# Patient Record
Sex: Female | Born: 1965 | Race: Black or African American | Hispanic: No | Marital: Married | Smoking: Former smoker
Health system: Southern US, Community
[De-identification: ages and names within clinical notes are randomized; demographics above are authoritative.]

## PROBLEM LIST (undated history)

## (undated) DIAGNOSIS — I10 Essential (primary) hypertension: Secondary | ICD-10-CM

## (undated) DIAGNOSIS — K219 Gastro-esophageal reflux disease without esophagitis: Secondary | ICD-10-CM

## (undated) DIAGNOSIS — F32A Depression, unspecified: Secondary | ICD-10-CM

## (undated) DIAGNOSIS — F329 Major depressive disorder, single episode, unspecified: Secondary | ICD-10-CM

## (undated) DIAGNOSIS — F319 Bipolar disorder, unspecified: Secondary | ICD-10-CM

## (undated) DIAGNOSIS — D649 Anemia, unspecified: Secondary | ICD-10-CM

## (undated) DIAGNOSIS — T7840XA Allergy, unspecified, initial encounter: Secondary | ICD-10-CM

## (undated) HISTORY — DX: Gastro-esophageal reflux disease without esophagitis: K21.9

## (undated) HISTORY — DX: Essential (primary) hypertension: I10

## (undated) HISTORY — DX: Allergy, unspecified, initial encounter: T78.40XA

## (undated) HISTORY — DX: Anemia, unspecified: D64.9

## (undated) HISTORY — DX: Bipolar disorder, unspecified: F31.9

## (undated) HISTORY — DX: Depression, unspecified: F32.A

## (undated) HISTORY — DX: Major depressive disorder, single episode, unspecified: F32.9

---

## 2013-04-22 ENCOUNTER — Ambulatory Visit: Payer: Self-pay

## 2013-05-06 ENCOUNTER — Ambulatory Visit: Payer: Self-pay

## 2013-06-25 LAB — HM PAP SMEAR: HM PAP: NORMAL

## 2013-06-25 LAB — HM MAMMOGRAPHY: HM MAMMO: NORMAL

## 2013-07-07 DIAGNOSIS — Z79899 Other long term (current) drug therapy: Secondary | ICD-10-CM | POA: Diagnosis not present

## 2013-10-05 DIAGNOSIS — L0390 Cellulitis, unspecified: Secondary | ICD-10-CM | POA: Diagnosis not present

## 2013-10-05 DIAGNOSIS — L0291 Cutaneous abscess, unspecified: Secondary | ICD-10-CM | POA: Diagnosis not present

## 2013-11-11 DIAGNOSIS — F200 Paranoid schizophrenia: Secondary | ICD-10-CM | POA: Diagnosis not present

## 2014-01-29 DIAGNOSIS — F4310 Post-traumatic stress disorder, unspecified: Secondary | ICD-10-CM | POA: Diagnosis not present

## 2014-03-09 DIAGNOSIS — F331 Major depressive disorder, recurrent, moderate: Secondary | ICD-10-CM | POA: Diagnosis not present

## 2014-03-16 DIAGNOSIS — F331 Major depressive disorder, recurrent, moderate: Secondary | ICD-10-CM | POA: Diagnosis not present

## 2014-05-05 DIAGNOSIS — F331 Major depressive disorder, recurrent, moderate: Secondary | ICD-10-CM | POA: Diagnosis not present

## 2014-06-09 DIAGNOSIS — I83893 Varicose veins of bilateral lower extremities with other complications: Secondary | ICD-10-CM | POA: Diagnosis not present

## 2014-06-09 DIAGNOSIS — M545 Low back pain: Secondary | ICD-10-CM | POA: Diagnosis not present

## 2014-06-09 DIAGNOSIS — Z0000 Encounter for general adult medical examination without abnormal findings: Secondary | ICD-10-CM | POA: Diagnosis not present

## 2014-06-09 DIAGNOSIS — R400 Somnolence: Secondary | ICD-10-CM | POA: Diagnosis not present

## 2014-06-09 DIAGNOSIS — Z7189 Other specified counseling: Secondary | ICD-10-CM | POA: Diagnosis not present

## 2014-09-16 DIAGNOSIS — H10023 Other mucopurulent conjunctivitis, bilateral: Secondary | ICD-10-CM | POA: Diagnosis not present

## 2014-11-29 DIAGNOSIS — F310 Bipolar disorder, current episode hypomanic: Secondary | ICD-10-CM | POA: Diagnosis not present

## 2015-01-26 DIAGNOSIS — F310 Bipolar disorder, current episode hypomanic: Secondary | ICD-10-CM | POA: Diagnosis not present

## 2015-03-10 ENCOUNTER — Ambulatory Visit (INDEPENDENT_AMBULATORY_CARE_PROVIDER_SITE_OTHER): Payer: Medicare Other | Admitting: Family Medicine

## 2015-03-10 ENCOUNTER — Encounter: Payer: Self-pay | Admitting: Family Medicine

## 2015-03-10 VITALS — BP 138/88 | HR 66 | Temp 98.0°F | Resp 16 | Ht 67.0 in | Wt 223.6 lb

## 2015-03-10 DIAGNOSIS — J40 Bronchitis, not specified as acute or chronic: Secondary | ICD-10-CM | POA: Diagnosis not present

## 2015-03-10 DIAGNOSIS — E669 Obesity, unspecified: Secondary | ICD-10-CM | POA: Diagnosis not present

## 2015-03-10 DIAGNOSIS — J309 Allergic rhinitis, unspecified: Secondary | ICD-10-CM | POA: Diagnosis not present

## 2015-03-10 DIAGNOSIS — F319 Bipolar disorder, unspecified: Secondary | ICD-10-CM | POA: Insufficient documentation

## 2015-03-10 DIAGNOSIS — Z23 Encounter for immunization: Secondary | ICD-10-CM | POA: Diagnosis not present

## 2015-03-10 DIAGNOSIS — F3170 Bipolar disorder, currently in remission, most recent episode unspecified: Secondary | ICD-10-CM

## 2015-03-10 DIAGNOSIS — Z720 Tobacco use: Secondary | ICD-10-CM

## 2015-03-10 DIAGNOSIS — F17200 Nicotine dependence, unspecified, uncomplicated: Secondary | ICD-10-CM | POA: Insufficient documentation

## 2015-03-10 MED ORDER — DOXYCYCLINE HYCLATE 100 MG PO CAPS: 100.0000 mg | ORAL_CAPSULE | Freq: Two times a day (BID) | ORAL | Status: DC

## 2015-03-10 NOTE — Progress Notes (Signed)
Date:  03/10/2015   Name:  Dawn Baldwin   DOB:  1965/11/30   MRN:  [id number]  PCP:  Fidel Levy, MD    Chief Complaint: Cough   History of Present Illness:  This is a 49 y.o. female, new pt to this practice, with 5d hx cough productive brown phlegm, sore throat, rhinorrhea, nasal congestion, mild B otalgia. Sxs not improving. Smokes one cigar a day. Hx bipolar d/o but unsure what med taking (on Risperdal previously). No known infectious exposure except grandchild has ear infection  Review of Systems:  Review of Systems  Constitutional: Negative for fever and chills.  HENT: Negative for trouble swallowing.   Respiratory: Negative for shortness of breath, wheezing and stridor.   Cardiovascular: Negative for chest pain.    Patient Active Problem List   Diagnosis Date Noted  . Bipolar affective disorder 03/10/2015  . Allergic rhinitis 03/10/2015  . Smoker 03/10/2015  . Obesity (BMI 30-39.9) 03/10/2015    Prior to Admission medications   Medication Sig Start Date End Date Taking? Authorizing Provider  doxycycline (VIBRAMYCIN) 100 MG capsule Take 1 capsule (100 mg total) by mouth 2 (two) times daily. 03/10/15   Schuyler Amor, MD    No Known Allergies  No past surgical history on file.  Social History  Substance Use Topics  . Smoking status: Current Every Day Smoker -- 1.00 packs/day    Types: Cigars  . Smokeless tobacco: Not on file  . Alcohol Use: Yes     Comment: 2 cans of beer week    No family history on file.  Medication list has been reviewed and updated.  Physical Examination: BP 138/88 mmHg  Pulse 66  Temp(Src) 98 F (36.7 C) (Oral)  Resp 16  Ht  (1.702 m)  Wt 223 lb 9.6 oz (101.424 kg)  BMI 35.01 kg/m2  SpO2 100%  LMP 02/12/2015  Physical Exam  Constitutional: She appears well-developed and well-nourished.  HENT:  Right Ear: External ear normal.  Left Ear: External ear normal.  Mouth/Throat: No oropharyngeal exudate.  OP mild  erythema B TM clear  Neck: Neck supple. No thyromegaly present.  Pulmonary/Chest: Effort normal and breath sounds normal. She has no wheezes. She has no rales.  Musculoskeletal: She exhibits no edema.  Lymphadenopathy:    She has no cervical adenopathy.  Neurological: She is alert.  Skin: Skin is warm and dry.  Psychiatric: She has a normal mood and affect. Her behavior is normal.    Assessment and Plan:  1. Bronchitis - doxycycline (VIBRAMYCIN) 100 MG capsule; Take 1 capsule (100 mg total) by mouth 2 (two) times daily.  Dispense: 14 capsule; Refill: 0  2. Bipolar affective disorder in remission Release signed to obtain records including meds  3. Allergic rhinitis, unspecified allergic rhinitis type Using OTC nasal spray, unsure of name  4. Smoker Recommended cessation  5. Obesity (BMI 30-39.9) Address next visit  Return in about 4 weeks (around 04/07/2015), or if symptoms worsen or fail to improve.  Dionne Ano. Kingsley Spittle MD [facility]  03/10/2015

## 2015-03-28 ENCOUNTER — Ambulatory Visit: Payer: Self-pay | Admitting: Family Medicine

## 2015-05-13 ENCOUNTER — Encounter: Payer: Self-pay | Admitting: Family Medicine

## 2015-05-13 ENCOUNTER — Ambulatory Visit (INDEPENDENT_AMBULATORY_CARE_PROVIDER_SITE_OTHER): Payer: Medicare Other | Admitting: Family Medicine

## 2015-05-13 VITALS — BP 130/76 | HR 66 | Temp 97.4°F | Resp 26 | Ht 68.0 in | Wt 221.8 lb

## 2015-05-13 DIAGNOSIS — Z8249 Family history of ischemic heart disease and other diseases of the circulatory system: Secondary | ICD-10-CM

## 2015-05-13 DIAGNOSIS — J309 Allergic rhinitis, unspecified: Secondary | ICD-10-CM

## 2015-05-13 DIAGNOSIS — Z720 Tobacco use: Secondary | ICD-10-CM

## 2015-05-13 DIAGNOSIS — F17200 Nicotine dependence, unspecified, uncomplicated: Secondary | ICD-10-CM

## 2015-05-13 DIAGNOSIS — Z0000 Encounter for general adult medical examination without abnormal findings: Secondary | ICD-10-CM | POA: Diagnosis not present

## 2015-05-13 DIAGNOSIS — M545 Low back pain, unspecified: Secondary | ICD-10-CM

## 2015-05-13 DIAGNOSIS — F3170 Bipolar disorder, currently in remission, most recent episode unspecified: Secondary | ICD-10-CM

## 2015-05-13 DIAGNOSIS — E669 Obesity, unspecified: Secondary | ICD-10-CM

## 2015-05-13 NOTE — Assessment & Plan Note (Signed)
Reviewed health risks of obesity. Pt encouraged to continue regular exercise. Referred to DisposableN[employer].[email or web address]  Encouraged 150 minutes exercise per week.

## 2015-05-13 NOTE — Progress Notes (Signed)
Subjective:    Patient ID: Dawn Baldwin, female    DOB: 1965-07-02, 49 y.o.   MRN: [id number]  HPI: Dawn Baldwin is a 49 y.o. female presenting on 05/13/2015 for Annual Exam   HPI  Pt presents for preventative care visit after establishing care for a sick visit in September. Overall doing well.  Pt presents to establish care today. Previous care provider was at [facility].  It has been about 1 year last PCP visit. Records from previous provider will be requested and reviewed. Current medical problems include:  Bipolar disorder: Managed at [facility]. Doing well. Seasonal allergies: Occasional symptoms Low back pain: Chronic. Feels like her back pops when she exercise. 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  Former smoker- Quit 1 month ago!!    Health maintenance:  Last Pap 2015- normal. Abnormal pap in 1993. Cryotherapy done.  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  Patient desires mammogram this year.   Regular exercise- has home gym and [employer]. Walks dogs daily.  LMP: 11/14- her husband had a vasectomy.     Past Medical History  Diagnosis Date  . Allergy     No current outpatient prescriptions on file prior to visit.   No current facility-administered medications on file prior to visit.    Review of Systems  Constitutional: Negative for fever and chills.  HENT: Negative.   Respiratory: Negative for cough, chest tightness and wheezing.   Cardiovascular: Negative for chest pain and leg swelling.  Gastrointestinal: Negative for nausea, vomiting, abdominal pain, diarrhea and constipation.  Endocrine: Negative.  Negative for cold intolerance, heat intolerance, polydipsia, polyphagia and polyuria.  Genitourinary: Negative for dysuria and difficulty urinating.  Musculoskeletal: Positive for back pain.  Neurological: Negative for dizziness, light-headedness and numbness.  Psychiatric/Behavioral: Negative.    Per HPI unless specifically indicated above       Objective:    BP 130/76 mmHg  Pulse 66  Temp(Src) 97.4 F (36.3 C) (Oral)  Resp 26  Ht  (1.727 m)  Wt 221 lb 12.8 oz (100.608 kg)  BMI 33.73 kg/m2  LMP 05/09/2015  Wt Readings from Last 3 Encounters:  05/13/15 221 lb 12.8 oz (100.608 kg)  03/10/15 223 lb 9.6 oz (101.424 kg)    Physical Exam  Constitutional: She is oriented to person, place, and time. She appears well-developed and well-nourished.  HENT:  Head: Normocephalic and atraumatic.  Neck: Neck supple.  Cardiovascular: Normal rate, regular rhythm and normal heart sounds.  Exam reveals no gallop and no friction rub.   No murmur heard. Pulmonary/Chest: Effort normal and breath sounds normal. She has no wheezes. She exhibits no tenderness.  Abdominal: Soft. Normal appearance and bowel sounds are normal. She exhibits no distension and no mass. There is no tenderness. There is no rebound and no guarding.  Musculoskeletal: Normal range of motion. She exhibits no edema.       Lumbar back: She exhibits tenderness (L umbar spine). She exhibits normal range of motion, no edema, no deformity and no spasm.  Lymphadenopathy:    She has no cervical adenopathy.  Neurological: She is alert and oriented to person, place, and time.  Skin: Skin is warm and dry.   Results for orders placed or performed in visit on 05/13/15  HM MAMMOGRAPHY  Result Value Ref Range   HM Mammogram normal   HM PAP SMEAR  Result Value Ref Range   HM Pap smear normal       Assessment &  Plan:   Problem List Items Addressed This Visit      Respiratory   Allergic rhinitis    Continue flonase OTC.       Relevant Orders   Comprehensive metabolic panel     Other   Bipolar affective disorder (HCC)    Managed at [facility].       Smoker    Quit smoking 1 mos ago. Encouraged to continue positive changes. Referred to [place] [employer]uitline for further resources.       Obesity (BMI 30-39.9)    Reviewed health risks of obesity. Pt encouraged to continue regular  exercise. Referred to DisposableN[employer].[email or web address]  Encouraged 150 minutes exercise per week.         Relevant Orders   Lipid panel    Other Visit Diagnoses    Preventative health care    -  Primary    Reviewed health maintenance needs. Discussed mammogram guidelines. Pt would like to screen for breast cancer this year.     Family history of heart disease        Relevant Orders    Lipid panel    Left-sided low back pain without sciatica           No orders of the defined types were placed in this encounter.      Follow up plan: Return in about 1 year (around 05/12/2016).

## 2015-05-13 NOTE — Assessment & Plan Note (Signed)
Quit smoking 1 mos ago. Encouraged to continue positive changes. Referred to [place] Quitline for further resources.

## 2015-05-13 NOTE — Patient Instructions (Signed)

## 2015-05-13 NOTE — Assessment & Plan Note (Signed)
Continue flonase OTC.

## 2015-05-13 NOTE — Assessment & Plan Note (Signed)
Managed at [facility].  

## 2015-05-17 DIAGNOSIS — F310 Bipolar disorder, current episode hypomanic: Secondary | ICD-10-CM | POA: Diagnosis not present

## 2015-05-23 DIAGNOSIS — J309 Allergic rhinitis, unspecified: Secondary | ICD-10-CM | POA: Diagnosis not present

## 2015-05-23 DIAGNOSIS — Z8249 Family history of ischemic heart disease and other diseases of the circulatory system: Secondary | ICD-10-CM | POA: Diagnosis not present

## 2015-05-23 DIAGNOSIS — E669 Obesity, unspecified: Secondary | ICD-10-CM | POA: Diagnosis not present

## 2015-05-24 LAB — LIPID PANEL
CHOL/HDL RATIO: 2.9 ratio (ref 0.0–4.4)
Cholesterol, Total: 186 mg/dL (ref 100–199)
HDL: 65 mg/dL (ref >39)
LDL Calculated: 93 mg/dL (ref 0–99)
Triglycerides: 141 mg/dL (ref 0–149)
VLDL Cholesterol Cal: 28 mg/dL (ref 5–40)

## 2015-05-24 LAB — COMPREHENSIVE METABOLIC PANEL
ALBUMIN: 4.5 g/dL (ref 3.5–5.5)
ALT: 18 IU/L (ref 0–32)
AST: 17 IU/L (ref 0–40)
Albumin/Globulin Ratio: 1.9 (ref 1.1–2.5)
Alkaline Phosphatase: 56 IU/L (ref 39–117)
BUN / CREAT RATIO: 15 (ref 9–23)
BUN: 13 mg/dL (ref 6–24)
Bilirubin Total: 0.3 mg/dL (ref 0.0–1.2)
CALCIUM: 9.8 mg/dL (ref 8.7–10.2)
CHLORIDE: 101 mmol/L (ref 97–106)
CO2: 25 mmol/L (ref 18–29)
CREATININE: 0.89 mg/dL (ref 0.57–1.00)
GFR calc non Af Amer: 77 mL/min/{1.73_m2} (ref >59)
GFR, EST AFRICAN AMERICAN: 89 mL/min/{1.73_m2} (ref >59)
GLUCOSE: 88 mg/dL (ref 65–99)
Globulin, Total: 2.4 g/dL (ref 1.5–4.5)
Potassium: 4.2 mmol/L (ref 3.5–5.2)
Sodium: 140 mmol/L (ref 136–144)
TOTAL PROTEIN: 6.9 g/dL (ref 6.0–8.5)

## 2015-06-02 ENCOUNTER — Encounter: Payer: Self-pay | Admitting: Family Medicine

## 2015-06-02 ENCOUNTER — Emergency Department: Payer: Medicare Other

## 2015-06-02 ENCOUNTER — Ambulatory Visit (INDEPENDENT_AMBULATORY_CARE_PROVIDER_SITE_OTHER): Payer: Medicare Other | Admitting: Family Medicine

## 2015-06-02 ENCOUNTER — Emergency Department
Admission: EM | Admit: 2015-06-02 | Discharge: 2015-06-02 | Disposition: A | Discharge status: Home / Self Care | Payer: Medicare Other | Attending: Emergency Medicine | Admitting: Emergency Medicine

## 2015-06-02 ENCOUNTER — Encounter: Payer: Self-pay | Admitting: Emergency Medicine

## 2015-06-02 VITALS — BP 160/100 | HR 85 | Temp 97.7°F | Resp 14

## 2015-06-02 DIAGNOSIS — R519 Headache, unspecified: Secondary | ICD-10-CM

## 2015-06-02 DIAGNOSIS — H538 Other visual disturbances: Secondary | ICD-10-CM | POA: Diagnosis not present

## 2015-06-02 DIAGNOSIS — H53143 Visual discomfort, bilateral: Secondary | ICD-10-CM | POA: Diagnosis not present

## 2015-06-02 DIAGNOSIS — Z87891 Personal history of nicotine dependence: Secondary | ICD-10-CM | POA: Insufficient documentation

## 2015-06-02 DIAGNOSIS — F40298 Other specified phobia: Secondary | ICD-10-CM | POA: Insufficient documentation

## 2015-06-02 DIAGNOSIS — Z3202 Encounter for pregnancy test, result negative: Secondary | ICD-10-CM | POA: Diagnosis not present

## 2015-06-02 DIAGNOSIS — IMO0001 Reserved for inherently not codable concepts without codable children: Secondary | ICD-10-CM

## 2015-06-02 DIAGNOSIS — R51 Headache: Secondary | ICD-10-CM | POA: Diagnosis not present

## 2015-06-02 DIAGNOSIS — R112 Nausea with vomiting, unspecified: Secondary | ICD-10-CM

## 2015-06-02 DIAGNOSIS — R030 Elevated blood-pressure reading, without diagnosis of hypertension: Secondary | ICD-10-CM

## 2015-06-02 DIAGNOSIS — G4485 Primary stabbing headache: Secondary | ICD-10-CM | POA: Diagnosis not present

## 2015-06-02 LAB — CBC
HCT: 40.9 % (ref 35.0–47.0)
Hemoglobin: 13.5 g/dL (ref 12.0–16.0)
MCH: 31.7 pg (ref 26.0–34.0)
MCHC: 32.9 g/dL (ref 32.0–36.0)
MCV: 96.2 fL (ref 80.0–100.0)
PLATELETS: 285 10*3/uL (ref 150–440)
RBC: 4.26 MIL/uL (ref 3.80–5.20)
RDW: 12.2 % (ref 11.5–14.5)
WBC: 5.2 10*3/uL (ref 3.6–11.0)

## 2015-06-02 LAB — DIFFERENTIAL
Basophils Absolute: 0 10*3/uL (ref 0–0.1)
Basophils Relative: 1 %
EOS ABS: 0.1 10*3/uL (ref 0–0.7)
EOS PCT: 2 %
LYMPHS ABS: 1.5 10*3/uL (ref 1.0–3.6)
Lymphocytes Relative: 29 %
Monocytes Absolute: 0.3 10*3/uL (ref 0.2–0.9)
Monocytes Relative: 7 %
NEUTROS PCT: 61 %
Neutro Abs: 3.2 10*3/uL (ref 1.4–6.5)

## 2015-06-02 LAB — COMPREHENSIVE METABOLIC PANEL
ALBUMIN: 4.8 g/dL (ref 3.5–5.0)
ALT: 23 U/L (ref 14–54)
ANION GAP: 7 (ref 5–15)
AST: 20 U/L (ref 15–41)
Alkaline Phosphatase: 49 U/L (ref 38–126)
BILIRUBIN TOTAL: 0.6 mg/dL (ref 0.3–1.2)
BUN: 13 mg/dL (ref 6–20)
CO2: 28 mmol/L (ref 22–32)
Calcium: 9.6 mg/dL (ref 8.9–10.3)
Chloride: 106 mmol/L (ref 101–111)
Creatinine, Ser: 0.79 mg/dL (ref 0.44–1.00)
GFR calc Af Amer: >60 mL/min (ref >60)
GFR calc non Af Amer: >60 mL/min (ref >60)
GLUCOSE: 88 mg/dL (ref 65–99)
POTASSIUM: 3.9 mmol/L (ref 3.5–5.1)
SODIUM: 141 mmol/L (ref 135–145)
TOTAL PROTEIN: 7.9 g/dL (ref 6.5–8.1)

## 2015-06-02 LAB — PROTIME-INR
INR: 0.93
PROTHROMBIN TIME: 12.7 s (ref 11.4–15.0)

## 2015-06-02 LAB — HCG, QUANTITATIVE, PREGNANCY: hCG, Beta Chain, Quant, S: 3 m[IU]/mL (ref <5)

## 2015-06-02 LAB — GLUCOSE, CAPILLARY: Glucose-Capillary: 73 mg/dL (ref 65–99)

## 2015-06-02 LAB — APTT: aPTT: 34 seconds (ref 24–36)

## 2015-06-02 MED ORDER — LIDOCAINE HCL (PF) 1 % IJ SOLN: 5.0000 mL | Freq: Once | INTRAMUSCULAR | Status: DC

## 2015-06-02 MED ORDER — LIDOCAINE HCL (PF) 1 % IJ SOLN
30.0000 mL | Freq: Once | INTRAMUSCULAR | Status: DC
  Filled 2015-06-02: qty 30

## 2015-06-02 MED ORDER — HYDROMORPHONE HCL 1 MG/ML IJ SOLN
1.0000 mg | Freq: Once | INTRAMUSCULAR | Status: AC
  Administered 2015-06-02: 1 mg via INTRAVENOUS
  Filled 2015-06-02: qty 1

## 2015-06-02 MED ORDER — HYDROMORPHONE HCL 1 MG/ML IJ SOLN
INTRAMUSCULAR | Status: AC
  Administered 2015-06-02: 0.5 mg via INTRAVENOUS
  Filled 2015-06-02: qty 1

## 2015-06-02 MED ORDER — PROCHLORPERAZINE EDISYLATE 5 MG/ML IJ SOLN
10.0000 mg | Freq: Four times a day (QID) | INTRAMUSCULAR | Status: DC | PRN
  Administered 2015-06-02: 10 mg via INTRAVENOUS
  Filled 2015-06-02: qty 2

## 2015-06-02 MED ORDER — SODIUM CHLORIDE 0.9 % IV BOLUS (SEPSIS)
1000.0000 mL | Freq: Once | INTRAVENOUS | Status: AC
  Administered 2015-06-02: 1000 mL via INTRAVENOUS

## 2015-06-02 MED ORDER — ONDANSETRON HCL 4 MG/2ML IJ SOLN
4.0000 mg | Freq: Once | INTRAMUSCULAR | Status: AC
  Administered 2015-06-02: 4 mg via INTRAVENOUS

## 2015-06-02 MED ORDER — HYDROMORPHONE HCL 1 MG/ML IJ SOLN
0.5000 mg | Freq: Once | INTRAMUSCULAR | Status: AC
  Administered 2015-06-02: 0.5 mg via INTRAVENOUS

## 2015-06-02 MED ORDER — LIDOCAINE HCL (PF) 1 % IJ SOLN
INTRAMUSCULAR | Status: AC
  Filled 2015-06-02: qty 5

## 2015-06-02 MED ORDER — ONDANSETRON HCL 4 MG/2ML IJ SOLN
INTRAMUSCULAR | Status: AC
  Administered 2015-06-02: 4 mg via INTRAVENOUS
  Filled 2015-06-02: qty 2

## 2015-06-02 NOTE — ED Notes (Signed)
Patient transported to CT 

## 2015-06-02 NOTE — ED Notes (Signed)
C/o worst HA ever starting at 4 am. Right frontal HA. Blurry vision bilateral. No speech abnormalities, no facial droop, no motor deficits. Has nausea.

## 2015-06-02 NOTE — Progress Notes (Signed)
Subjective:    Patient ID: Dawn Baldwin, female    DOB: December 25, 1965, 48 y.o.   MRN: [id number]  HPI: Dawn Baldwin is a 49 y.o. female presenting on 06/02/2015 for Headache   HPI  Pt presents for HA. Woke her up from sleep this morning with the worst HA of her life. Sensitive to light and sound. BP is very elevated. Vomiting x 3. Pt reports she broke out in a cold sweat at work.  Pain is behind her eyes and high up in her head. Pulsing pain. She took tylenol at home for HA no relief.    Past Medical History  Diagnosis Date  . Allergy     No current outpatient prescriptions on file prior to visit.   No current facility-administered medications on file prior to visit.    Review of Systems  Constitutional: Positive for diaphoresis. Negative for fever and chills.  Eyes: Positive for photophobia and visual disturbance.  Respiratory: Negative for chest tightness, shortness of breath and wheezing.   Cardiovascular: Negative for chest pain, palpitations and leg swelling.  Gastrointestinal: Positive for vomiting.  Neurological: Positive for headaches.   Per HPI unless specifically indicated above     Objective:    BP 160/100 mmHg  Pulse 85  Temp(Src) 97.7 F (36.5 C) (Oral)  Resp 14  LMP 05/09/2015  Wt Readings from Last 3 Encounters:  05/13/15 221 lb 12.8 oz (100.608 kg)  03/10/15 223 lb 9.6 oz (101.424 kg)    Physical Exam  Constitutional: She is oriented to person, place, and time. She appears well-developed and well-nourished.  HENT:  Head: Normocephalic and atraumatic.  Eyes: Conjunctivae and EOM are normal. Pupils are equal, round, and reactive to light. Lids are everted and swept, no foreign bodies found.  Neck: Normal range of motion. Neck supple. No Brudzinski's sign and no Kernig's sign noted.  Cardiovascular: Normal rate and regular rhythm.  Exam reveals no gallop and no friction rub.   No murmur heard. Pulmonary/Chest: Effort normal. No respiratory distress.  She has no rales.  Abdominal: Soft. Normal appearance and bowel sounds are normal. There is no tenderness.  Neurological: She is alert and oriented to person, place, and time. She has normal strength and normal reflexes. No cranial nerve deficit or sensory deficit. She displays a negative Romberg sign. Coordination and gait normal.   Results for orders placed or performed in visit on 05/13/15  HM MAMMOGRAPHY  Result Value Ref Range   HM Mammogram normal   HM PAP SMEAR  Result Value Ref Range   HM Pap smear normal   Lipid panel  Result Value Ref Range   Cholesterol, Total 186 100 - 199 mg/dL   Triglycerides 130 0 - 149 mg/dL   HDL 65 >86 mg/dL   VLDL Cholesterol Cal 28 5 - 40 mg/dL   LDL Calculated 93 0 - 99 mg/dL   Chol/HDL Ratio 2.9 0.0 - 4.4 ratio units  Comprehensive metabolic panel  Result Value Ref Range   Glucose 88 65 - 99 mg/dL   BUN 13 6 - 24 mg/dL   Creatinine, Ser 5.78 0.57 - 1.00 mg/dL   GFR calc non Af Amer 77 >59 mL/min/1.73   GFR calc Af Amer 89 >59 mL/min/1.73   BUN/Creatinine Ratio 15 9 - 23   Sodium 140 136 - 144 mmol/L   Potassium 4.2 3.5 - 5.2 mmol/L   Chloride 101 97 - 106 mmol/L   CO2 25 18 - 29 mmol/L   Calcium  9.8 8.7 - 10.2 mg/dL   Total Protein 6.9 6.0 - 8.5 g/dL   Albumin 4.5 3.5 - 5.5 g/dL   Globulin, Total 2.4 1.5 - 4.5 g/dL   Albumin/Globulin Ratio 1.9 1.1 - 2.5   Bilirubin Total 0.3 0.0 - 1.2 mg/dL   Alkaline Phosphatase 56 39 - 117 IU/L   AST 17 0 - 40 IU/L   ALT 18 0 - 32 IU/L      Assessment & Plan:   Problem List Items Addressed This Visit    None    Visit Diagnoses    Elevated BP    -  Primary    BP elevated. Unclear if due pain to BP elevation is causing HA.  Sent to ER for evaluation.  Report called to ER charge RN. EMS took patient.  BP lying down 160/102    Primary stabbing headache        ER eval because HA woke her from sleep, elevated BP,  N/V, diaphoresis. Neurologically intact. Elevated BP. Sent via EMS. Report called to  charge RN.         No orders of the defined types were placed in this encounter.      Follow up plan: No Follow-up on file.

## 2015-06-02 NOTE — ED Provider Notes (Signed)
[facility] Emergency Department Provider Note  ____________________________________________  Time seen: Approximately 9:50 AM  I have reviewed the triage vital signs and the nursing notes.   HISTORY  Chief Complaint Headache    HPI Dawn Baldwin is a 49 y.o. female with a history of headaches presenting with severe headache. Patient reports that she woke up this morning with a severe "throbbing" headache in the right frontal region of her head and behind her eye. It was associated with nausea and vomiting, photophobia and phonophobia. She denies any neck pain or stiff neck, fever, chills, numbness tingling or weakness, or difficulty walking. No known trauma.   Past Medical History  Diagnosis Date  . Allergy     Patient Active Problem List   Diagnosis Date Noted  . Bipolar affective disorder (HCC) 03/10/2015  . Allergic rhinitis 03/10/2015  . Smoker 03/10/2015  . Obesity (BMI 30-39.9) 03/10/2015   Family history: Negative for cerebral aneurysm  Social history: Quit smoking 2 months ago. Denies cocaine. History reviewed. No pertinent past surgical history.  No current outpatient prescriptions on file.  Allergies Review of patient's allergies indicates no known allergies.  Family History  Problem Relation Age of Onset  . Hypertension Mother     Social History Social History  Substance Use Topics  . Smoking status: Former Smoker -- 1.00 packs/day    Types: Cigars    Quit date: 03/26/2015  . Smokeless tobacco: None  . Alcohol Use: Yes     Comment: 2 cans of beer week    Review of Systems Constitutional: No fever/chills. No lightheadedness or syncope. Eyes: No visual changes. Positive bilateral blurred vision. Positive photophobia. ENT: No sore throat. Cardiovascular: Denies chest pain, palpitations. Respiratory: Denies shortness of breath.  No cough. Gastrointestinal: No abdominal pain.  Positive nausea, positive vomiting.  No  diarrhea.  No constipation. Genitourinary: Negative for dysuria. Musculoskeletal: Negative for back pain. Skin: Negative for rash. Neurological: Positive for severe frontal headache. Negative for numbness tingling or weakness.  10-point ROS otherwise negative.  ____________________________________________   PHYSICAL EXAM:  VITAL SIGNS: ED Triage Vitals  Enc Vitals Group     BP 06/02/15 0940 195/104 mmHg     Pulse Rate 06/02/15 0940 64     Resp 06/02/15 0940 16     Temp 06/02/15 0940 97.7 F (36.5 C)     Temp Source 06/02/15 0940 Oral     SpO2 06/02/15 0940 100 %     Weight 06/02/15 0940 216 lb 14.9 oz (98.4 kg)     Height 06/02/15 0940 5\' 7"  (1.702 m)     Head Cir --      Peak Flow --      Pain Score --      Pain Loc --      Pain Edu? --      Excl. in GC? --     Constitutional: Alert and oriented. Uncomfortable but nontoxic.Marland Kitchen. Answer question appropriately. Eyes: Conjunctivae are normal.  EOMI. no scleral icterus. PERRLA. Head: Atraumatic. No raccoon eyes or Battle sign. Nose: No congestion/rhinnorhea. Mouth/Throat: Mucous membranes are moist.  Neck: No stridor.  Supple.  Trachea is midline. No meningismus. Cardiovascular: Normal rate, regular rhythm. No murmurs, rubs or gallops.  Respiratory: Normal respiratory effort.  No retractions. Lungs CTAB.  No wheezes, rales or ronchi. Gastrointestinal: Soft and nontender. No distention. No peritoneal signs. Musculoskeletal: No LE edema.  Neurologic:  Patient is alert and oriented 3. Face and smile are symmetric. EOMI and PERRLA.  Good strength in all her extremities. Skin:  Skin is warm, dry and intact. No rash noted. Psychiatric: Mood and affect are normal. Speech and behavior are normal.  Normal judgement.  ____________________________________________   LABS (all labs ordered are listed, but only abnormal results are displayed)  Labs Reviewed  PROTIME-INR  APTT  CBC  DIFFERENTIAL  COMPREHENSIVE METABOLIC PANEL   HCG, QUANTITATIVE, PREGNANCY  GLUCOSE, CAPILLARY  URINALYSIS COMPLETEWITH MICROSCOPIC ([facility] ONLY)  CBG MONITORING, ED   ____________________________________________  EKG  Not indicated ____________________________________________  RADIOLOGY  Ct Head Wo Contrast  06/02/2015  CLINICAL DATA:  Code stroke. Worse headache of life. High blood pressure and vomiting. Pain behind eyes. Pulsating pain. EXAM: CT HEAD WITHOUT CONTRAST TECHNIQUE: Contiguous axial images were obtained from the base of the skull through the vertex without intravenous contrast. COMPARISON:  None. FINDINGS: No acute intracranial hemorrhage. No focal mass lesion. No CT evidence of acute infarction. No midline shift or mass effect. No hydrocephalus. Basilar cisterns are patent. Paranasal sinuses and  mastoid air cells are clear. IMPRESSION: 1. CT evidence of cortical infarction. 2. No intracranial hemorrhage. 3. Normal head CT. Findings conveyed toANNE-CAROLINE Roshon Duell on 06/02/2015  at10:10. Electronically Signed   By: Genevive Bi M.D.   On: 06/02/2015 10:12    ____________________________________________   PROCEDURES  Procedure(s) performed: None  Critical Care performed: No ____________________________________________   INITIAL IMPRESSION / ASSESSMENT AND PLAN / ED COURSE  Pertinent labs & imaging results that were available during my care of the patient were reviewed by me and considered in my medical decision making (see chart for details).  49 y.o. female with a history of mild headaches presenting with an atypically severe headache associated with nausea vomiting, blurred vision, photophobia and phonophobia. It is possible that the patient is having an atypical migraine however I'm concerned about subarachnoid hemorrhage.  She is hypertensive today and I will treat her pain and recheck her blood pressure after we have the CT scan results.  ----------------------------------------- 11:19 AM on  06/02/2015 -----------------------------------------  The patient continues to have a headache although it is slightly improved. She states that her nausea has completely resolved at this time. Her CT scan is negative and her labs are reassuring. I have talked to the patient about lumbar puncture, explaining the procedure as well as the risks and the benefits of doing that test. I have told her that given the symptoms that she has, I am still concerned about subarachnoid hemorrhage. At this time, the patient would like to defer testing for more symptomatic treatment.  ----------------------------------------- 1:52 PM on 06/02/2015 -----------------------------------------  At this time, the patient is feeling much better and wishes discharge. I have had a long conversation with her about performing an LP in order to rule out subarachnoid hemorrhage, but she is refusing and prefers discharge at this time. I have had a long discussion with the patient and her husband about return precautions and follow-up instructions. She understands that if she leaves the hospital and has a subarachnoid hemorrhage that she may have permanent neurologic damage or even death.   ____________________________________________  FINAL CLINICAL IMPRESSION(S) / ED DIAGNOSES  Final diagnoses:  Acute nonintractable headache, unspecified headache type  Non-intractable vomiting with nausea, vomiting of unspecified type  Photophobia of both eyes  Phonophobia      NEW MEDICATIONS STARTED DURING THIS VISIT:  New Prescriptions   No medications on file     Rockne Menghini, MD 06/02/15 1353

## 2015-06-02 NOTE — Discharge Instructions (Signed)
Please drink plenty of fluids stay well-hydrated and get plenty of rest. You may take Tylenol for headache. Today, you are having a severe headache and had a CT scan that was reassuring. As we discussed, and negative CT scan does not mean that we can say for sure that you did not have bleeding in your brain. By leaving today without a lumbar puncture, we are not 100% sure that you do not have bleeding in her brain. If your headache worsens, if you develop visual changes, mental status changes, numbness tingling or weakness, vomiting, or fever, return to the emergency department immediately.

## 2015-06-03 ENCOUNTER — Encounter: Payer: Self-pay | Admitting: Family Medicine

## 2015-06-03 ENCOUNTER — Ambulatory Visit (INDEPENDENT_AMBULATORY_CARE_PROVIDER_SITE_OTHER): Payer: Medicare Other | Admitting: Family Medicine

## 2015-06-03 VITALS — BP 160/90 | HR 66 | Temp 98.4°F | Resp 16 | Ht 68.0 in | Wt 219.0 lb

## 2015-06-03 DIAGNOSIS — I10 Essential (primary) hypertension: Secondary | ICD-10-CM

## 2015-06-03 DIAGNOSIS — G4485 Primary stabbing headache: Secondary | ICD-10-CM | POA: Diagnosis not present

## 2015-06-03 MED ORDER — AMLODIPINE BESYLATE 5 MG PO TABS: 5.0000 mg | ORAL_TABLET | Freq: Every day | ORAL | Status: DC

## 2015-06-03 MED ORDER — BLOOD PRESSURE CUFF MISC: 1.0000 | Freq: Every day | Status: AC

## 2015-06-03 NOTE — Progress Notes (Signed)
Subjective:    Patient ID: Dawn Baldwin, female    DOB: 10/06/1965, 49 y.o.   MRN: [id number]  HPI: Dawn Baldwin is a 49 y.o. female presenting on 06/03/2015 for Hypertension   HPI  Pt presents for ER follow-up. Transfer[facility] to [facility] by EMS for HA and elevated BP yesterday.  CT negative. HA is gone but BP is still high. No visual changes. Pt has been borderline hypertensive in the past. Denies CP, sweating, visual changes, SOB.  Past Medical History  Diagnosis Date  . Allergy     No current outpatient prescriptions on file prior to visit.   No current facility-administered medications on file prior to visit.    Review of Systems  Constitutional: Negative for fever and chills.  HENT: Negative.   Respiratory: Negative for cough, chest tightness and wheezing.   Cardiovascular: Negative for chest pain and leg swelling.  Gastrointestinal: Negative for nausea, vomiting, abdominal pain, diarrhea and constipation.  Endocrine: Negative.  Negative for cold intolerance, heat intolerance, polydipsia, polyphagia and polyuria.  Genitourinary: Negative for dysuria and difficulty urinating.  Musculoskeletal: Negative.   Neurological: Negative for dizziness, light-headedness and numbness.  Psychiatric/Behavioral: Negative.    Per HPI unless specifically indicated above     Objective:    BP 160/90 mmHg  Pulse 66  Temp(Src) 98.4 F (36.9 C) (Oral)  Resp 16  Ht 5\' 8"  (1.727 m)  Wt 219 lb (99.338 kg)  BMI 33.31 kg/m2  LMP 05/09/2015  Wt Readings from Last 3 Encounters:  06/03/15 219 lb (99.338 kg)  06/02/15 216 lb 14.9 oz (98.4 kg)  05/13/15 221 lb 12.8 oz (100.608 kg)    BP Readings from Last 3 Encounters:  06/03/15 160/90  06/02/15 156/84  06/02/15 160/100    Physical Exam  Constitutional: She is oriented to person, place, and time. She appears well-developed and well-nourished.  HENT:  Head: Normocephalic and atraumatic.  Neck: Neck supple.  Cardiovascular: Normal rate,  regular rhythm and normal heart sounds.  Exam reveals no gallop and no friction rub.   No murmur heard. Pulmonary/Chest: Effort normal and breath sounds normal. She has no wheezes. She exhibits no tenderness.  Abdominal: Soft. Normal appearance and bowel sounds are normal. She exhibits no distension and no mass. There is no tenderness. There is no rebound and no guarding.  Musculoskeletal: Normal range of motion. She exhibits no edema or tenderness.  Lymphadenopathy:    She has no cervical adenopathy.  Neurological: She is alert and oriented to person, place, and time.  Skin: Skin is warm and dry.   Results for orders placed or performed during the hospital encounter of 06/02/15  Protime-INR  Result Value Ref Range   Prothrombin Time 12.7 11.4 - 15.0 seconds   INR 0.93   APTT  Result Value Ref Range   aPTT 34 24 - 36 seconds  CBC  Result Value Ref Range   WBC 5.2 3.6 - 11.0 K/uL   RBC 4.26 3.80 - 5.20 MIL/uL   Hemoglobin 13.5 12.0 - 16.0 g/dL   HCT 96.740.9 89.335.0 - 81.047.0 %   MCV 96.2 80.0 - 100.0 fL   MCH 31.7 26.0 - 34.0 pg   MCHC 32.9 32.0 - 36.0 g/dL   RDW 17.512.2 10.211.5 - 58.514.5 %   Platelets 285 150 - 440 K/uL  Differential  Result Value Ref Range   Neutrophils Relative % 61 %   Neutro Abs 3.2 1.4 - 6.5 K/uL   Lymphocytes Relative 29 %  Lymphs Abs 1.5 1.0 - 3.6 K/uL   Monocytes Relative 7 %   Monocytes Absolute 0.3 0.2 - 0.9 K/uL   Eosinophils Relative 2 %   Eosinophils Absolute 0.1 0 - 0.7 K/uL   Basophils Relative 1 %   Basophils Absolute 0.0 0 - 0.1 K/uL  Comprehensive metabolic panel  Result Value Ref Range   Sodium 141 135 - 145 mmol/L   Potassium 3.9 3.5 - 5.1 mmol/L   Chloride 106 101 - 111 mmol/L   CO2 28 22 - 32 mmol/L   Glucose, Bld 88 65 - 99 mg/dL   BUN 13 6 - 20 mg/dL   Creatinine, Ser 1.61 0.44 - 1.00 mg/dL   Calcium 9.6 8.9 - 09.6 mg/dL   Total Protein 7.9 6.5 - 8.1 g/dL   Albumin 4.8 3.5 - 5.0 g/dL   AST 20 15 - 41 U/L   ALT 23 14 - 54 U/L   Alkaline  Phosphatase 49 38 - 126 U/L   Total Bilirubin 0.6 0.3 - 1.2 mg/dL   GFR calc non Af Amer >60 >60 mL/min   GFR calc Af Amer >60 >60 mL/min   Anion gap 7 5 - 15  hCG, quantitative, pregnancy  Result Value Ref Range   hCG, Beta Chain, Quant, S 3 <5 mIU/mL  Glucose, capillary  Result Value Ref Range   Glucose-Capillary 73 65 - 99 mg/dL      Assessment & Plan:   Problem List Items Addressed This Visit      Cardiovascular and Mediastinum   Hypertension - Primary    Start CCB today given AA descent. Pt cautioned to recheck BP daily at home.  Call if BP > 170/90 consistently. Go to ER if >200/100 with symptoms.   DASH diet reviewed. CMP normal.  Consider diurectic with refractory hypertension.  RTC 2 weeks.      Relevant Medications   amLODipine (NORVASC) 5 MG tablet    Other Visit Diagnoses    Primary stabbing headache        Appears resolved. ER MD was concerned for subarchnoid hemorrhage. Pt cautioned to return to ER or urgent care if HA, sweating, visual changes,  resumed.     Relevant Medications    amLODipine (NORVASC) 5 MG tablet       Meds ordered this encounter  Medications  . amLODipine (NORVASC) 5 MG tablet    Sig: Take 1 tablet (5 mg total) by mouth daily.    Dispense:  90 tablet    Refill:  3    Order Specific Question:  Supervising Provider    Answer:  Janeann Forehand [phone]  . Blood Pressure Monitoring (BLOOD PRESSURE CUFF) MISC    Sig: 1 each by Does not apply route daily.    Dispense:  1 each    Refill:  0    Order Specific Question:  Supervising Provider    Answer:  Janeann Forehand [[id number]]      Follow up plan: Return in about 2 weeks (around 06/17/2015) for BP check.

## 2015-06-03 NOTE — Assessment & Plan Note (Addendum)
Start CCB today given AA descent. Pt cautioned to recheck BP daily at home.  Call if BP > 170/90 consistently. Go to ER if >200/100 with symptoms.   DASH diet reviewed. CMP normal.  Consider diurectic with refractory hypertension.  RTC 2 weeks.

## 2015-06-03 NOTE — Assessment & Plan Note (Signed)
Encouraged to quit smoking as it can drive up her BP.

## 2015-06-03 NOTE — Patient Instructions (Signed)
Please seek immediate medical attention at ER or Urgent Care if you develop: Chest pain, pressure or tightness. Shortness of breath accompanied by nausea or diaphoresis Visual changes Numbness or tingling on one side of the body Facial droop Altered mental status Or any concerning symptoms. Or if you BP is >190/>100  Your goal blood pressure is 140/90 Work on low salt/sodium diet - goal <2.5gm (2,500mg ) per day. Eat a diet high in fruits/vegetables and whole grains.  Look into mediterranean and DASH diet. Goal activity is 12450min/wk of moderate intensity exercise.  This can be split into 30 minute chunks.  If you are not at this level, you can start with smaller 10-15 min increments and slowly build up activity. Look at www.heart.org for more resources  Try [facility] for your hot flashes.

## 2015-06-06 ENCOUNTER — Telehealth: Payer: Self-pay | Admitting: Family Medicine

## 2015-06-06 NOTE — Telephone Encounter (Signed)
Pt has concerns about her BP fluctuating and asked about something for anxiety.  Please call [phone].

## 2015-06-06 NOTE — Telephone Encounter (Signed)
Please let Dawn Baldwin know we can talk about a medication for anxiety when she follows up to check her BP on 12/15.  If her BP is very high at home, I need to see her sooner. Thanks! AK

## 2015-06-06 NOTE — Telephone Encounter (Signed)
Patient notified that anxiety will be discussed at 06/09/15 appt. She will continue to monitor bp at home.

## 2015-06-09 ENCOUNTER — Ambulatory Visit (INDEPENDENT_AMBULATORY_CARE_PROVIDER_SITE_OTHER): Payer: Medicare Other | Admitting: Family Medicine

## 2015-06-09 ENCOUNTER — Encounter: Payer: Self-pay | Admitting: Family Medicine

## 2015-06-09 VITALS — BP 138/98 | HR 74 | Resp 16 | Ht 68.0 in | Wt 214.4 lb

## 2015-06-09 DIAGNOSIS — I10 Essential (primary) hypertension: Secondary | ICD-10-CM | POA: Diagnosis not present

## 2015-06-09 DIAGNOSIS — N951 Menopausal and female climacteric states: Secondary | ICD-10-CM

## 2015-06-09 DIAGNOSIS — F3170 Bipolar disorder, currently in remission, most recent episode unspecified: Secondary | ICD-10-CM | POA: Diagnosis not present

## 2015-06-09 MED ORDER — AMLODIPINE BESYLATE 10 MG PO TABS: 10.0000 mg | ORAL_TABLET | Freq: Every day | ORAL | Status: DC

## 2015-06-09 NOTE — Assessment & Plan Note (Signed)
Systolic is controlled but diastolic remains high. Increase amlodipine to 10mg  daily to reduce diastolic BP. Encouraged DASH diet at home.  Pt told to continue to check BP. Call the clinic if diastolic is >120 consistently. We may need to adjust her medication.  RTC 2 weeks for BP check.

## 2015-06-09 NOTE — Patient Instructions (Signed)
I am increasing your amlodipine to 10 mg per day. You can take 2 pills of the 5mg  until your bottle is empty and pick up the new prescription.   Your goal blood pressure is  140/90. Work on low salt/sodium diet - goal <2.5gm (2,500mg ) per day. Eat a diet high in fruits/vegetables and whole grains.  Look into mediterranean and DASH diet. Goal activity is 15250min/wk of moderate intensity exercise.  This can be split into 30 minute chunks.  If you are not at this level, you can start with smaller 10-15 min increments and slowly build up activity. Look at www.heart.org for more resources  Please seek immediate medical attention at ER or Urgent Care if you develop: Chest pain, pressure or tightness. Shortness of breath accompanied by nausea or diaphoresis Visual changes Numbness or tingling on one side of the body Facial droop Altered mental status Or any concerning symptoms.

## 2015-06-09 NOTE — Progress Notes (Signed)
Subjective:    Patient ID: Dawn Baldwin, female    DOB: 1966/04/10, 49 y.o.   MRN: [id number]  HPI: Dawn Baldwin is a 49 y.o. female presenting on 06/09/2015 for Hypertension   HPI  Pt presents for BP check. Avg 130-150/90-120.  No HA visual changes. No SOB. No dizziness. No swelling in the ankles. Checking BP at home and resting.   Pt also has concerns about anxiety. She is bipolar and being treated at [facility] but she cannot tolerate the depakote prescribed. She would like me to address her anxiety today. Symptoms are preventing her from sleeping.   Pt also has concerns about hot flashes. She is perimenopausal. Hot flashes occur a few times a day. She is having trouble sleeping at night due to hot flashes. She sleeps in loose clothing. She also uses a fan. She tried [place] OTC but it upset her stomach.   Past Medical History  Diagnosis Date  . Allergy   . Bipolar affective disorder [facility])     Current Outpatient Prescriptions on File Prior to Visit  Medication Sig  . Blood Pressure Monitoring (BLOOD PRESSURE CUFF) MISC 1 each by Does not apply route daily.   No current facility-administered medications on file prior to visit.    Review of Systems  Constitutional: Negative for fever and chills.  HENT: Negative.   Respiratory: Negative for shortness of breath.   Cardiovascular: Negative for chest pain, palpitations and leg swelling.  Gastrointestinal: Negative for abdominal pain and abdominal distention.  Endocrine: Positive for heat intolerance (hot flashes.). Negative for cold intolerance, polydipsia, polyphagia and polyuria.  Genitourinary: Negative.   Musculoskeletal: Negative.   Neurological: Negative for dizziness, numbness and headaches.  Psychiatric/Behavioral: Positive for sleep disturbance. Negative for suicidal ideas. The patient is nervous/anxious.    Per HPI unless specifically indicated above     Objective:    BP 138/98 mmHg  Pulse 74  Resp 16  Ht   (1.727 m)  Wt 214 lb 6.4 oz (97.251 kg)  BMI 32.61 kg/m2  LMP 05/09/2015  Wt Readings from Last 3 Encounters:  06/09/15 214 lb 6.4 oz (97.251 kg)  06/03/15 219 lb (99.338 kg)  06/02/15 216 lb 14.9 oz (98.4 kg)    Physical Exam  Constitutional: She is oriented to person, place, and time. She appears well-developed and well-nourished. No distress.  Neck: Normal range of motion. Neck supple. No thyromegaly present.  Cardiovascular: Normal rate and regular rhythm.  Exam reveals no gallop and no friction rub.   No murmur heard. Pulmonary/Chest: Effort normal and breath sounds normal.  Abdominal: Soft. Bowel sounds are normal. There is no tenderness. There is no rebound.  Musculoskeletal: Normal range of motion. She exhibits no edema or tenderness.  Lymphadenopathy:    She has no cervical adenopathy.  Neurological: She is alert and oriented to person, place, and time.  Skin: Skin is warm. Diaphoretic: pt had hot flash at the time of the exam.   Psychiatric: She has a normal mood and affect. Her behavior is normal. Judgment and thought content normal.   Results for orders placed or performed during the hospital encounter of 06/02/15  Protime-INR  Result Value Ref Range   Prothrombin Time 12.7 11.4 - 15.0 seconds   INR 0.93   APTT  Result Value Ref Range   aPTT 34 24 - 36 seconds  CBC  Result Value Ref Range   WBC 5.2 3.6 - 11.0 K/uL   RBC 4.26 3.80 - 5.20 MIL/uL  Hemoglobin 13.5 12.0 - 16.0 g/dL   HCT 16.1 09.6 - 04.5 %   MCV 96.2 80.0 - 100.0 fL   MCH 31.7 26.0 - 34.0 pg   MCHC 32.9 32.0 - 36.0 g/dL   RDW 40.9 81.1 - 91.4 %   Platelets 285 150 - 440 K/uL  Differential  Result Value Ref Range   Neutrophils Relative % 61 %   Neutro Abs 3.2 1.4 - 6.5 K/uL   Lymphocytes Relative 29 %   Lymphs Abs 1.5 1.0 - 3.6 K/uL   Monocytes Relative 7 %   Monocytes Absolute 0.3 0.2 - 0.9 K/uL   Eosinophils Relative 2 %   Eosinophils Absolute 0.1 0 - 0.7 K/uL   Basophils Relative 1 %    Basophils Absolute 0.0 0 - 0.1 K/uL  Comprehensive metabolic panel  Result Value Ref Range   Sodium 141 135 - 145 mmol/L   Potassium 3.9 3.5 - 5.1 mmol/L   Chloride 106 101 - 111 mmol/L   CO2 28 22 - 32 mmol/L   Glucose, Bld 88 65 - 99 mg/dL   BUN 13 6 - 20 mg/dL   Creatinine, Ser 7.82 0.44 - 1.00 mg/dL   Calcium 9.6 8.9 - 95.6 mg/dL   Total Protein 7.9 6.5 - 8.1 g/dL   Albumin 4.8 3.5 - 5.0 g/dL   AST 20 15 - 41 U/L   ALT 23 14 - 54 U/L   Alkaline Phosphatase 49 38 - 126 U/L   Total Bilirubin 0.6 0.3 - 1.2 mg/dL   GFR calc non Af Amer >60 >60 mL/min   GFR calc Af Amer >60 >60 mL/min   Anion gap 7 5 - 15  hCG, quantitative, pregnancy  Result Value Ref Range   hCG, Beta Chain, Quant, S 3 <5 mIU/mL  Glucose, capillary  Result Value Ref Range   Glucose-Capillary 73 65 - 99 mg/dL      Assessment & Plan:   Problem List Items Addressed This Visit      Cardiovascular and Mediastinum   Hypertension - Primary     Systolic is controlled but diastolic remains high. Increase amlodipine to  daily to reduce diastolic BP. Encouraged DASH diet at home.  Pt told to continue to check BP. Call the clinic if diastolic is >120 consistently. We may need to adjust her medication.  RTC 2 weeks for BP check.       Relevant Medications   amLODipine (NORVASC) 10 MG tablet     Other   Bipolar affective disorder [facility])    Have encouraged pt to call her [facility] provider to discuss this medication and her anxiety. Given her bipolar disorder, I am reluctant to add any medications that might interfere with her current treatment.  Pt instructed to call [facility] to discuss side effects and possible other medications for anxiety.       Hot flushes, perimenopausal    Discussed use of black cohosh OTC for symptoms. Encouraged loose clothing and a fan. Also encouraged weight loss to help with symptoms.  Consider vitamin E in low doses as next visit. Also consider clonidine for dual BP control as well as hot  flash symptoms.           Meds ordered this encounter  Medications  . DISCONTD: doxycycline (VIBRAMYCIN) 100 MG capsule    Sig: Take 100 mg by mouth 2 (two) times daily.    Refill:  0  . divalproex (DEPAKOTE) 500 MG DR tablet  Sig: Take 500 mg by mouth at bedtime. Take 3 tabs at once at hour of sleep.  Marland Kitchen. amLODipine (NORVASC) 10 MG tablet    Sig: Take 1 tablet (10 mg total) by mouth daily.    Dispense:  90 tablet    Refill:  3    Order Specific Question:  Supervising Provider    Answer:  Janeann ForehandHAWKINS JR, JAMES H [[id number]]      Follow up plan: Return in about 2 weeks (around 06/23/2015) for BP check. .Marland Kitchen

## 2015-06-09 NOTE — Assessment & Plan Note (Signed)
Discussed use of black cohosh OTC for symptoms. Encouraged loose clothing and a fan. Also encouraged weight loss to help with symptoms.  Consider vitamin E in low doses as next visit. Also consider clonidine for dual BP control as well as hot flash symptoms.

## 2015-06-09 NOTE — Assessment & Plan Note (Signed)
Have encouraged pt to call her [facility] provider to discuss this medication and her anxiety. Given her bipolar disorder, I am reluctant to add any medications that might interfere with her current treatment.  Pt instructed to call [facility] to discuss side effects and possible other medications for anxiety.

## 2015-06-22 ENCOUNTER — Encounter: Payer: Self-pay | Admitting: Family Medicine

## 2015-06-22 ENCOUNTER — Ambulatory Visit (INDEPENDENT_AMBULATORY_CARE_PROVIDER_SITE_OTHER): Payer: Medicare Other | Admitting: Family Medicine

## 2015-06-22 VITALS — BP 136/88 | HR 87 | Temp 97.7°F | Resp 16 | Ht 68.0 in | Wt 220.0 lb

## 2015-06-22 DIAGNOSIS — I10 Essential (primary) hypertension: Secondary | ICD-10-CM

## 2015-06-22 NOTE — Patient Instructions (Signed)
Your goal blood pressure is 140/90 Work on low salt/sodium diet - goal <1.5gm (1,500mg) per day. Eat a diet high in fruits/vegetables and whole grains.  Look into mediterranean and DASH diet. Goal activity is 150min/wk of moderate intensity exercise.  This can be split into 30 minute chunks.  If you are not at this level, you can start with smaller 10-15 min increments and slowly build up activity. Look at www.heart.org for more resources  Please seek immediate medical attention at ER or Urgent Care if you develop: Chest pain, pressure or tightness. Shortness of breath accompanied by nausea or diaphoresis Visual changes Numbness or tingling on one side of the body Facial droop Altered mental status Or any concerning symptoms.   

## 2015-06-22 NOTE — Assessment & Plan Note (Signed)
BP controlled in the office today. Current current regimen. Pt tolerating well. DASH diet reviewed.  Alarm symptoms reviewed. RTC 3 mos.

## 2015-06-22 NOTE — Progress Notes (Signed)
Subjective:    Patient ID: Dawn Baldwin, female    DOB: 26-Oct-1965, 49 y.o.   MRN: [id number]  HPI: Remie Mathison is a 49 y.o. female presenting on 06/22/2015 for Hypertension   HPI  Pt presents for BP check today. She has been checking at home. Avg 130/70-80.  No HA or visual changes. No chest pain. No shortness of breath. Small amt of swelling in her ankles.   Past Medical History  Diagnosis Date  . Allergy   . Bipolar affective disorder [facility])     Current Outpatient Prescriptions on File Prior to Visit  Medication Sig  . amLODipine (NORVASC) 10 MG tablet Take 1 tablet (10 mg total) by mouth daily.  . Blood Pressure Monitoring (BLOOD PRESSURE CUFF) MISC 1 each by Does not apply route daily.  . divalproex (DEPAKOTE) 500 MG DR tablet Take 500 mg by mouth at bedtime. Take 3 tabs at once at hour of sleep.   No current facility-administered medications on file prior to visit.    Review of Systems  Constitutional: Negative for fever and chills.  HENT: Negative.   Respiratory: Negative for shortness of breath.   Cardiovascular: Negative for chest pain, palpitations and leg swelling.  Gastrointestinal: Negative for abdominal pain and abdominal distention.  Endocrine: Negative for cold intolerance, heat intolerance, polydipsia, polyphagia and polyuria.  Genitourinary: Negative.   Musculoskeletal: Negative.   Neurological: Negative for dizziness, numbness and headaches.  Psychiatric/Behavioral: Negative.    Per HPI unless specifically indicated above     Objective:    BP 136/88 mmHg  Pulse 87  Temp(Src) 97.7 F (36.5 C) (Oral)  Resp 16  Ht  (1.727 m)  Wt 220 lb (99.791 kg)  BMI 33.46 kg/m2  Wt Readings from Last 3 Encounters:  06/22/15 220 lb (99.791 kg)  06/09/15 214 lb 6.4 oz (97.251 kg)  06/03/15 219 lb (99.338 kg)    Physical Exam Results for orders placed or performed during the hospital encounter of 06/02/15  Protime-INR  Result Value Ref Range   Prothrombin Time 12.7 11.4 - 15.0 seconds   INR 0.93   APTT  Result Value Ref Range   aPTT 34 24 - 36 seconds  CBC  Result Value Ref Range   WBC 5.2 3.6 - 11.0 K/uL   RBC 4.26 3.80 - 5.20 MIL/uL   Hemoglobin 13.5 12.0 - 16.0 g/dL   HCT 65.7 84.6 - 96.2 %   MCV 96.2 80.0 - 100.0 fL   MCH 31.7 26.0 - 34.0 pg   MCHC 32.9 32.0 - 36.0 g/dL   RDW 95.2 84.1 - 32.4 %   Platelets 285 150 - 440 K/uL  Differential  Result Value Ref Range   Neutrophils Relative % 61 %   Neutro Abs 3.2 1.4 - 6.5 K/uL   Lymphocytes Relative 29 %   Lymphs Abs 1.5 1.0 - 3.6 K/uL   Monocytes Relative 7 %   Monocytes Absolute 0.3 0.2 - 0.9 K/uL   Eosinophils Relative 2 %   Eosinophils Absolute 0.1 0 - 0.7 K/uL   Basophils Relative 1 %   Basophils Absolute 0.0 0 - 0.1 K/uL  Comprehensive metabolic panel  Result Value Ref Range   Sodium 141 135 - 145 mmol/L   Potassium 3.9 3.5 - 5.1 mmol/L   Chloride 106 101 - 111 mmol/L   CO2 28 22 - 32 mmol/L   Glucose, Bld 88 65 - 99 mg/dL   BUN 13 6 - 20 mg/dL  Creatinine, Ser 0.79 0.44 - 1.00 mg/dL   Calcium 9.6 8.9 - 14.710.3 mg/dL   Total Protein 7.9 6.5 - 8.1 g/dL   Albumin 4.8 3.5 - 5.0 g/dL   AST 20 15 - 41 U/L   ALT 23 14 - 54 U/L   Alkaline Phosphatase 49 38 - 126 U/L   Total Bilirubin 0.6 0.3 - 1.2 mg/dL   GFR calc non Af Amer >60 >60 mL/min   GFR calc Af Amer >60 >60 mL/min   Anion gap 7 5 - 15  hCG, quantitative, pregnancy  Result Value Ref Range   hCG, Beta Chain, Quant, S 3 <5 mIU/mL  Glucose, capillary  Result Value Ref Range   Glucose-Capillary 73 65 - 99 mg/dL      Assessment & Plan:   Problem List Items Addressed This Visit      Cardiovascular and Mediastinum   Hypertension - Primary    BP controlled in the office today. Current current regimen. Pt tolerating well. DASH diet reviewed.  Alarm symptoms reviewed. RTC 3 mos.          No orders of the defined types were placed in this encounter.      Follow up plan: Return in about 3  months (around 09/20/2015) for BP check. .Marland Kitchen

## 2015-08-16 DIAGNOSIS — F310 Bipolar disorder, current episode hypomanic: Secondary | ICD-10-CM | POA: Diagnosis not present

## 2015-09-13 ENCOUNTER — Ambulatory Visit: Payer: Self-pay | Admitting: Family Medicine

## 2015-09-14 ENCOUNTER — Encounter: Payer: Self-pay | Admitting: Family Medicine

## 2015-09-19 ENCOUNTER — Telehealth: Payer: Self-pay | Admitting: Family Medicine

## 2015-09-19 ENCOUNTER — Ambulatory Visit (INDEPENDENT_AMBULATORY_CARE_PROVIDER_SITE_OTHER): Payer: Medicare Other | Admitting: Family Medicine

## 2015-09-19 VITALS — BP 136/78 | HR 69 | Temp 98.0°F | Resp 16 | Ht 68.0 in | Wt 222.4 lb

## 2015-09-19 DIAGNOSIS — M545 Low back pain, unspecified: Secondary | ICD-10-CM

## 2015-09-19 LAB — POCT URINALYSIS DIPSTICK
Bilirubin, UA: NEGATIVE
Blood, UA: NEGATIVE
GLUCOSE UA: NEGATIVE
KETONES UA: NEGATIVE
LEUKOCYTES UA: NEGATIVE
Nitrite, UA: NEGATIVE
PROTEIN UA: NEGATIVE
Spec Grav, UA: 1.015
Urobilinogen, UA: NEGATIVE
pH, UA: 5

## 2015-09-19 MED ORDER — NAPROXEN 500 MG PO TABS: 500.0000 mg | ORAL_TABLET | Freq: Two times a day (BID) | ORAL | Status: DC

## 2015-09-19 MED ORDER — CYCLOBENZAPRINE HCL 10 MG PO TABS: 10.0000 mg | ORAL_TABLET | Freq: Three times a day (TID) | ORAL | Status: DC | PRN

## 2015-09-19 NOTE — Progress Notes (Signed)
Subjective:    Patient ID: Dawn Baldwin, female    DOB: 08/28/1965, 50 y.o.   MRN: [id number]  HPI: Dawn Squibbnnie Dedeaux is a 50 y.o. female presenting on 09/19/2015 for Back Pain   Back Pain This is a new problem. The current episode started 1 to 4 weeks ago. The problem has been waxing and waning since onset. The pain is present in the lumbar spine. The quality of the pain is described as aching. The pain does not radiate. The pain is at a severity of 7/10. The pain is moderate. The pain is worse during the night. The symptoms are aggravated by bending, lying down and twisting. Pertinent negatives include no abdominal pain, bladder incontinence, chest pain, dysuria, fever, numbness, paresthesias, pelvic pain, perianal numbness, tingling or weakness. She has tried NSAIDs for the symptoms. The treatment provided mild relief.    Pt presents for back pain. Low back pain both sides Noticed x 2 weeks ago.. Taking OTC ibuprofen and aleve with mild relief. Pain is keeping her awake at night. No radiation down back of legs. Most comfortable position is standing and moving around. Bending over is most uncomfortable.  No saddle anesthesia, no incontinence, no progressive weakness or numbness. 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 Her job is aggravating back pain.     Past Medical History  Diagnosis Date  . Allergy   . Bipolar affective disorder [facility](HCC)     Current Outpatient Prescriptions on File Prior to Visit  Medication Sig  . amLODipine (NORVASC) 10 MG tablet Take 1 tablet (10 mg total) by mouth daily.  . Blood Pressure Monitoring (BLOOD PRESSURE CUFF) MISC 1 each by Does not apply route daily.   No current facility-administered medications on file prior to visit.    Review of Systems  Constitutional: Negative for fever and chills.  HENT: Negative.   Respiratory: Negative for cough, chest tightness and wheezing.   Cardiovascular: Negative for chest  pain and leg swelling.  Gastrointestinal: Negative for nausea, vomiting, abdominal pain, diarrhea and constipation.  Endocrine: Negative.  Negative for cold intolerance, heat intolerance, polydipsia, polyphagia and polyuria.  Genitourinary: Negative for bladder incontinence, dysuria, difficulty urinating and pelvic pain.  Musculoskeletal: Positive for back pain. Negative for gait problem.  Neurological: Negative for dizziness, tingling, weakness, light-headedness, numbness and paresthesias.  Psychiatric/Behavioral: Negative.    Per HPI unless specifically indicated above     Objective:    BP 136/78 mmHg  Pulse 69  Temp(Src) 98 F (36.7 C) (Oral)  Resp 16  Ht 5\' 8"  (1.727 m)  Wt 222 lb 6.4 oz (100.88 kg)  BMI 33.82 kg/m2  Wt Readings from Last 3 Encounters:  09/19/15 222 lb 6.4 oz (100.88 kg)  06/22/15 220 lb (99.791 kg)  06/09/15 214 lb 6.4 oz (97.251 kg)    Physical Exam  Constitutional: She is oriented to person, place, and time. She appears well-developed and well-nourished.  HENT:  Head: Normocephalic and atraumatic.  Neck: Neck supple.  Cardiovascular: Normal rate, regular rhythm and normal heart sounds.  Exam reveals no gallop and no friction rub.   No murmur heard. Pulmonary/Chest: Effort normal and breath sounds normal. She has no wheezes. She exhibits no tenderness.  Abdominal: Soft. Normal appearance and bowel sounds are normal. She exhibits no distension and no mass. There is no tenderness. There is no rebound and no guarding.  Musculoskeletal: She exhibits no edema.       Lumbar back:  She exhibits tenderness. She exhibits normal range of motion (pain wiht twisting and bending but full ROM), no swelling, no edema and no spasm.  Lymphadenopathy:    She has no cervical adenopathy.  Neurological: She is alert and oriented to person, place, and time. She has normal strength.  Reflex Scores:      Patellar reflexes are 2+ on the right side and 2+ on the left  side. Negative straight leg test bilaterally  Skin: Skin is warm and dry.   Results for orders placed or performed in visit on 09/19/15  POCT urinalysis dipstick  Result Value Ref Range   Color, UA yellow    Clarity, UA clear    Glucose, UA negative    Bilirubin, UA negative    Ketones, UA negative    Spec Grav, UA 1.015    Blood, UA negative    pH, UA 5.0    Protein, UA negative    Urobilinogen, UA negative    Nitrite, UA negative    Leukocytes, UA Negative Negative      Assessment & Plan:   Problem List Items Addressed This Visit    None    Visit Diagnoses    Bilateral low back pain without sciatica    -  Primary    Likely MSK. NSAIDs and muscle relaxants for pain. Avoid heavy lifting and strenous activity. Heat, massage, gentle stretching at home. Reviewed alarm symptoms. Consider PT  Or imaging if pain is not improving. Discussed back injury prevention at work.      Relevant Medications    cyclobenzaprine (FLEXERIL) 10 MG tablet    naproxen (NAPROSYN) 500 MG tablet    Other Relevant Orders    POCT urinalysis dipstick (Completed)       Meds ordered this encounter  Medications  . QUEtiapine (SEROQUEL) 100 MG tablet    Sig: Take 100 mg by mouth at bedtime.    Refill:  0  . cyclobenzaprine (FLEXERIL) 10 MG tablet    Sig: Take 1 tablet (10 mg total) by mouth 3 (three) times daily as needed for muscle spasms.    Dispense:  30 tablet    Refill:  1    Order Specific Question:  Supervising Provider    Answer:  Janeann Forehand [phone]  . naproxen (NAPROSYN) 500 MG tablet    Sig: Take 1 tablet (500 mg total) by mouth 2 (two) times daily with a meal.    Dispense:  30 tablet    Refill:  1    Order Specific Question:  Supervising Provider    Answer:  Janeann Forehand [[id number]]      Follow up plan: Return in about 2 weeks (around 10/03/2015) for back pain.

## 2015-09-19 NOTE — Telephone Encounter (Signed)
Pt will be seeing by Amy at 2:45 pm for severe back pain pt didn't want to go to urgent care.

## 2015-09-19 NOTE — Telephone Encounter (Signed)
Pt. Have requested that you call her she was having bad back pains. Pt call  back 3 is  [phone]

## 2015-09-19 NOTE — Patient Instructions (Addendum)
Back pain: I think your back pain is muscle related. Take flexeril at night to help with pain. You can take the flexeril up to 3 times daily. Take naproxen twice daily to help with your pain.   Use heat and massage to help your back feel better.   Sleep with a pillow between your legs at night and have a pillow at your back.   If you develop progressive numbness, weakness, loss of feeling in your pelvis, or loss of bowel/bladder control please seek immediate medical attention.   We discussed potential pathology and long term risk of reoccurrence. Maintaining an ideal body habitus, regular exercise, proper lifting techniques and mindfulness of exacerbating factors will be useful in long term management.  Instructed on use of heating pad with exercises. Consider concomitant therapy with PT, massage therapist or chiropractor. May use anti-inflammatory medication and muscle relaxer as needed.  Back Exercises The following exercises strengthen the muscles that help to support the back. They also help to keep the lower back flexible. Doing these exercises can help to prevent back pain or lessen existing pain. If you have back pain or discomfort, try doing these exercises 2-3 times each day or as told by your health care provider. When the pain goes away, do them once each day, but increase the number of times that you repeat the steps for each exercise (do more repetitions). If you do not have back pain or discomfort, do these exercises once each day or as told by your health care provider. EXERCISES Single Knee to Chest Repeat these steps 3-5 times for each leg: 1. Lie on your back on a firm bed or the floor with your legs extended. 2. Bring one knee to your chest. Your other leg should stay extended and in contact with the floor. 3. Hold your knee in place by grabbing your knee or thigh. 4. Pull on your knee until you feel a gentle stretch in your lower back. 5. Hold the stretch for 10-30  seconds. 6. Slowly release and straighten your leg. Pelvic Tilt Repeat these steps 5-10 times: 1. Lie on your back on a firm bed or the floor with your legs extended. 2. Bend your knees so they are pointing toward the ceiling and your feet are flat on the floor. 3. Tighten your lower abdominal muscles to press your lower back against the floor. This motion will tilt your pelvis so your tailbone points up toward the ceiling instead of pointing to your feet or the floor. 4. With gentle tension and even breathing, hold this position for 5-10 seconds. Cat-Cow Repeat these steps until your lower back becomes more flexible: 1. Get into a hands-and-knees position on a firm surface. Keep your hands under your shoulders, and keep your knees under your hips. You may place padding under your knees for comfort. 2. Let your head hang down, and point your tailbone toward the floor so your lower back becomes rounded like the back of a cat. 3. Hold this position for 5 seconds. 4. Slowly lift your head and point your tailbone up toward the ceiling so your back forms a sagging arch like the back of a cow. 5. Hold this position for 5 seconds. Press-Ups Repeat these steps 5-10 times: 1. Lie on your abdomen (face-down) on the floor. 2. Place your palms near your head, about shoulder-width apart. 3. While you keep your back as relaxed as possible and keep your hips on the floor, slowly straighten your arms to raise  the top half of your body and lift your shoulders. Do not use your back muscles to raise your upper torso. You may adjust the placement of your hands to make yourself more comfortable. 4. Hold this position for 5 seconds while you keep your back relaxed. 5. Slowly return to lying flat on the floor. Bridges Repeat these steps 10 times: 1. Lie on your back on a firm surface. 2. Bend your knees so they are pointing toward the ceiling and your feet are flat on the floor. 3. Tighten your buttocks muscles  and lift your buttocks off of the floor until your waist is at almost the same height as your knees. You should feel the muscles working in your buttocks and the back of your thighs. If you do not feel these muscles, slide your feet 1-2 inches farther away from your buttocks. 4. Hold this position for 3-5 seconds. 5. Slowly lower your hips to the starting position, and allow your buttocks muscles to relax completely. If this exercise is too easy, try doing it with your arms crossed over your chest. Abdominal Crunches Repeat these steps 5-10 times: 1. Lie on your back on a firm bed or the floor with your legs extended. 2. Bend your knees so they are pointing toward the ceiling and your feet are flat on the floor. 3. Cross your arms over your chest. 4. Tip your chin slightly toward your chest without bending your neck. 5. Tighten your abdominal muscles and slowly raise your trunk (torso) high enough to lift your shoulder blades a tiny bit off of the floor. Avoid raising your torso higher than that, because it can put too much stress on your low back and it does not help to strengthen your abdominal muscles. 6. Slowly return to your starting position. Back Lifts Repeat these steps 5-10 times: 1. Lie on your abdomen (face-down) with your arms at your sides, and rest your forehead on the floor. 2. Tighten the muscles in your legs and your buttocks. 3. Slowly lift your chest off of the floor while you keep your hips pressed to the floor. Keep the back of your head in line with the curve in your back. Your eyes should be looking at the floor. 4. Hold this position for 3-5 seconds. 5. Slowly return to your starting position. SEEK MEDICAL CARE IF:  Your back pain or discomfort gets much worse when you do an exercise.  Your back pain or discomfort does not lessen within 2 hours after you exercise. If you have any of these problems, stop doing these exercises right away. Do not do them again unless your  health care provider says that you can. SEEK IMMEDIATE MEDICAL CARE IF:  You develop sudden, severe back pain. If this happens, stop doing the exercises right away. Do not do them again unless your health care provider says that you can.   This information is not intended to replace advice given to you by your health care provider. Make sure you discuss any questions you have with your health care provider.   Document Released: 07/19/2004 Document Revised: 03/02/2015 Document Reviewed: 08/05/2014 Elsevier Interactive Patient Education [employer]

## 2015-09-19 NOTE — Telephone Encounter (Signed)
It looks like this patient has an appt with me tomorrow for back pain.  If she wants, I can try to see her today. Or if it is severe and she cannot wait until tomorrow, she can go to urgent care. If she is having butt/pelvic numbness, incontinence of bowel or bladder, or progressive weakness or numbness of the legs, she needs to be seen in the ER. Thanks! AK

## 2015-09-20 ENCOUNTER — Ambulatory Visit: Payer: Self-pay | Admitting: Family Medicine

## 2015-10-03 ENCOUNTER — Ambulatory Visit: Payer: Self-pay | Admitting: Family Medicine

## 2015-10-11 DIAGNOSIS — F310 Bipolar disorder, current episode hypomanic: Secondary | ICD-10-CM | POA: Diagnosis not present

## 2015-10-17 ENCOUNTER — Telehealth: Payer: Self-pay | Admitting: Family Medicine

## 2015-10-17 DIAGNOSIS — B86 Scabies: Secondary | ICD-10-CM

## 2015-10-17 NOTE — Telephone Encounter (Signed)
Pt has a question about scabies.  Please call [phone]

## 2015-10-17 NOTE — Telephone Encounter (Signed)
If husband was only exposed but not infested, no.  Scabies requires intimate contact- living together, sharing sheets towels.  It is hard to pick up when just being in a house with a person with scabies for a short amount of time.   However if her husband did have scabies, she should be treated because it is likely she does too.

## 2015-10-17 NOTE — Telephone Encounter (Signed)
Patient states husband come in contact with scabies. He is a Nutritional therapistplumber, his physician have rx for a cream. She has 1 bump and was wondering if she needs to be treated. Patient would like to be called back by AK if possible.

## 2015-10-18 MED ORDER — PERMETHRIN 5 % EX CREA: TOPICAL_CREAM | CUTANEOUS | Status: DC

## 2015-10-18 NOTE — Telephone Encounter (Signed)
Sent to pharmacy on file. Closing this encounter.

## 2015-10-18 NOTE — Telephone Encounter (Signed)
Patient's spouse is being treated for scabies. Please send cream to pharmacy.

## 2015-10-27 ENCOUNTER — Ambulatory Visit (INDEPENDENT_AMBULATORY_CARE_PROVIDER_SITE_OTHER): Payer: Medicare Other | Admitting: Family Medicine

## 2015-10-27 ENCOUNTER — Encounter: Payer: Self-pay | Admitting: Family Medicine

## 2015-10-27 VITALS — BP 120/88 | HR 87 | Temp 98.6°F | Resp 16 | Ht 68.0 in | Wt 226.0 lb

## 2015-10-27 DIAGNOSIS — S46212A Strain of muscle, fascia and tendon of other parts of biceps, left arm, initial encounter: Secondary | ICD-10-CM

## 2015-10-27 DIAGNOSIS — S46112A Strain of muscle, fascia and tendon of long head of biceps, left arm, initial encounter: Secondary | ICD-10-CM | POA: Diagnosis not present

## 2015-10-27 MED ORDER — IBUPROFEN 600 MG PO TABS: 600.0000 mg | ORAL_TABLET | Freq: Three times a day (TID) | ORAL | Status: DC | PRN

## 2015-10-27 NOTE — Progress Notes (Signed)
Subjective:    Patient ID: Dawn Baldwin, female    DOB: 1965/07/14, 50 y.o.   MRN: [id number]  HPI: Dawn Baldwin is a 50 y.o. female presenting on 10/27/2015 for Arm Pain   HPI  Pt presents for L arm pain x1 mos. 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 Pain is located along the bicep and radiates to shoulder blade when she lifts the arm. Tender to palpation. Arm was swollen, no improved. No numbness or tingling in the arm. No locking clicking or popping in the shoulder. Most painful when raising and bending- 8/10 pain. Adduction and abduction 3/10 pain. Arm aches and throbs at night.  Pt has been treating with naproxen  once daily and taking a flexeril at night. Feels mild relief. Iced the arm- mild relief.   Past Medical History  Diagnosis Date  . Allergy   . Bipolar affective disorder (HCC)   . GERD (gastroesophageal reflux disease)   . Hypertension   . Anemia   . Depression     Current Outpatient Prescriptions on File Prior to Visit  Medication Sig  . amLODipine (NORVASC) 10 MG tablet Take 1 tablet (10 mg total) by mouth daily.  . Blood Pressure Monitoring (BLOOD PRESSURE CUFF) MISC 1 each by Does not apply route daily.  . cyclobenzaprine (FLEXERIL) 10 MG tablet Take 1 tablet (10 mg total) by mouth 3 (three) times daily as needed for muscle spasms.  Marland Kitchen QUEtiapine (SEROQUEL) 100 MG tablet Take 100 mg by mouth at bedtime.   No current facility-administered medications on file prior to visit.    Review of Systems  Constitutional: Negative for fever and chills.  HENT: Negative.   Respiratory: Negative for cough, chest tightness and wheezing.   Cardiovascular: Negative for chest pain and leg swelling.  Gastrointestinal: Negative for nausea, vomiting, abdominal pain, diarrhea and constipation.  Endocrine: Negative.  Negative for cold intolerance, heat intolerance, polydipsia, polyphagia and polyuria.  Genitourinary: Negative for dysuria and difficulty urinating.    Musculoskeletal: Positive for myalgias and arthralgias.  Neurological: Negative for dizziness, light-headedness and numbness.  Psychiatric/Behavioral: Negative.    Per HPI unless specifically indicated above     Objective:    BP 120/88 mmHg  Pulse 87  Temp(Src) 98.6 F (37 C) (Oral)  Resp 16  Ht  (1.727 m)  Wt 226 lb (102.513 kg)  BMI 34.37 kg/m2  Wt Readings from Last 3 Encounters:  10/27/15 226 lb (102.513 kg)  09/16/13 207 lb (93.895 kg)  09/19/15 222 lb 6.4 oz (100.88 kg)    Physical Exam  Constitutional: She is oriented to person, place, and time. She appears well-developed and well-nourished.  HENT:  Head: Normocephalic and atraumatic.  Neck: Neck supple.  Cardiovascular: Normal rate, regular rhythm and normal heart sounds.  Exam reveals no gallop and no friction rub.   No murmur heard. Pulmonary/Chest: Effort normal and breath sounds normal. She has no wheezes. She exhibits no tenderness.  Abdominal: Soft. Normal appearance and bowel sounds are normal. She exhibits no distension and no mass. There is no tenderness. There is no rebound and no guarding.  Musculoskeletal: Normal range of motion. She exhibits no edema.       Left shoulder: She exhibits normal range of motion, no bony tenderness, no swelling, no crepitus, normal pulse and normal strength.       Left elbow: She exhibits normal range of motion and no swelling.       Left upper arm: She exhibits  tenderness and swelling. She exhibits no edema, no deformity and no laceration.  Tender along the biceps tendon and bicep. Mild swelling. No AC joint tenderness.  Painful when extending arm  Lymphadenopathy:    She has no cervical adenopathy.  Neurological: She is alert and oriented to person, place, and time.  Skin: Skin is warm and dry.   Results for orders placed or performed in visit on 09/19/15  POCT urinalysis dipstick  Result Value Ref Range   Color, UA yellow    Clarity, UA clear    Glucose, UA  negative    Bilirubin, UA negative    Ketones, UA negative    Spec Grav, UA 1.015    Blood, UA negative    pH, UA 5.0    Protein, UA negative    Urobilinogen, UA negative    Nitrite, UA negative    Leukocytes, UA Negative Negative      Assessment & Plan:   Problem List Items Addressed This Visit    None    Visit Diagnoses    Biceps muscle strain, left, initial encounter    -  Primary    NSAIDs x2 weeks. Heat and ice. Compression recommended. No heavy lifting. Consider PT or ortho if symptoms persist. Recheck 2 weeks.     Relevant Medications    ibuprofen (ADVIL,MOTRIN) 600 MG tablet       Meds ordered this encounter  Medications  . ibuprofen (ADVIL,MOTRIN) 600 MG tablet    Sig: Take 1 tablet (600 mg total) by mouth every 8 (eight) hours as needed.    Dispense:  30 tablet    Refill:  1    Order Specific Question:  Supervising Provider    Answer:  Janeann ForehandHAWKINS JR, JAMES H [[id number]]      Follow up plan: Return in about 2 weeks (around 11/10/2015), or if symptoms worsen or fail to improve, for arm pain.

## 2015-10-27 NOTE — Patient Instructions (Signed)
Take ibuprofen three times daily for about 2 week. No heavy lifting. Ice and heat as needed. You can use the flexeril to help with aching at night.   If you are not getting better, we will have you see PT or an orthopedist.

## 2015-11-03 ENCOUNTER — Telehealth: Payer: Self-pay | Admitting: Family Medicine

## 2015-11-03 NOTE — Telephone Encounter (Signed)
Pt called states that she need a letter faxed to her  Job [phone] , that states that if a pt. is over 200 lbs she will need assistant will pulling,turning, lifting  Pt. Call back # is  [phone]

## 2015-11-03 NOTE — Telephone Encounter (Signed)
Letter is done and faxed

## 2015-11-14 ENCOUNTER — Ambulatory Visit
Admission: RE | Admit: 2015-11-14 | Discharge: 2015-11-14 | Disposition: A | Discharge status: Home / Self Care | Payer: Medicare Other | Source: Ambulatory Visit | Attending: Family Medicine | Admitting: Family Medicine

## 2015-11-14 ENCOUNTER — Ambulatory Visit (INDEPENDENT_AMBULATORY_CARE_PROVIDER_SITE_OTHER): Payer: Medicare Other | Admitting: Family Medicine

## 2015-11-14 VITALS — BP 142/92 | HR 77 | Temp 98.4°F | Resp 16 | Ht 68.0 in | Wt 230.0 lb

## 2015-11-14 DIAGNOSIS — M79662 Pain in left lower leg: Secondary | ICD-10-CM | POA: Insufficient documentation

## 2015-11-14 DIAGNOSIS — M7989 Other specified soft tissue disorders: Secondary | ICD-10-CM | POA: Insufficient documentation

## 2015-11-14 DIAGNOSIS — M25572 Pain in left ankle and joints of left foot: Secondary | ICD-10-CM | POA: Diagnosis not present

## 2015-11-14 DIAGNOSIS — S99912A Unspecified injury of left ankle, initial encounter: Secondary | ICD-10-CM

## 2015-11-14 DIAGNOSIS — X58XXXA Exposure to other specified factors, initial encounter: Secondary | ICD-10-CM | POA: Insufficient documentation

## 2015-11-14 DIAGNOSIS — M25512 Pain in left shoulder: Secondary | ICD-10-CM

## 2015-11-14 MED ORDER — MELOXICAM 15 MG PO TABS: 15.0000 mg | ORAL_TABLET | Freq: Every day | ORAL | Status: DC
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
## 2015-11-14 NOTE — Patient Instructions (Signed)
We will get an ultrasound of your leg to rule out a blood clot. This will be at the hospital. If you have a blood clot, you will go to the ER for treatment tonight. If you don't, we will follow-up to determine cause of symptoms.  Keep it wrapped and elevated to help with swelling.   Take meloxicam once daily to help with arm and ankle pain. You can take tylenol but no other advil or ibuprofen at home.   We will have you see an orthopedist for your arm pain.

## 2015-11-15 ENCOUNTER — Other Ambulatory Visit: Payer: Self-pay | Admitting: Family Medicine

## 2015-11-15 DIAGNOSIS — S99912A Unspecified injury of left ankle, initial encounter: Secondary | ICD-10-CM

## 2015-11-15 DIAGNOSIS — M25472 Effusion, left ankle: Secondary | ICD-10-CM

## 2015-11-15 DIAGNOSIS — S99922A Unspecified injury of left foot, initial encounter: Principal | ICD-10-CM

## 2015-11-17 ENCOUNTER — Telehealth: Payer: Self-pay | Admitting: Family Medicine

## 2015-11-17 NOTE — Telephone Encounter (Signed)
Called patient and she would like to go where she can get in quickest. [facility].Bertrand

## 2015-11-17 NOTE — Telephone Encounter (Signed)
A referral was sent to [facility] for pt to see Marcelline Matesodd Munday but [facility].  Please call Joni at [phone] ext [phone]

## 2015-12-01 DIAGNOSIS — M7542 Impingement syndrome of left shoulder: Secondary | ICD-10-CM | POA: Diagnosis not present

## 2015-12-05 ENCOUNTER — Other Ambulatory Visit: Payer: Self-pay

## 2015-12-05 DIAGNOSIS — M25512 Pain in left shoulder: Secondary | ICD-10-CM

## 2015-12-05 MED ORDER — MELOXICAM 15 MG PO TABS: 15.0000 mg | ORAL_TABLET | Freq: Every day | ORAL | Status: DC

## 2015-12-05 NOTE — Telephone Encounter (Signed)
Patient stated she went to Ortho doc and received an injection.  She is in need of a refill on meloxicam 15mg .

## 2015-12-05 NOTE — Addendum Note (Signed)
Addended byLaroy Apple: Hersh Minney L on: 12/05/2015 02:38 PM   Modules accepted: Orders

## 2015-12-05 NOTE — Telephone Encounter (Addendum)
Called the home number listed is not hers. LMTCB. Please let Dawn Baldwin know she received a 30 days supply of meloxicam once daily on 11/14/2015. It is too early for a refill- her insurance will not pay for it. I will send a refill but she should not take more than once daily for pain. She should be okay to fill the medication around next week. Thanks! AK

## 2015-12-06 NOTE — Telephone Encounter (Signed)
Left detailed message on cell.

## 2015-12-07 ENCOUNTER — Ambulatory Visit
Admission: RE | Admit: 2015-12-07 | Discharge: 2015-12-07 | Disposition: A | Discharge status: Home / Self Care | Payer: Medicare Other | Source: Ambulatory Visit | Attending: Family Medicine | Admitting: Family Medicine

## 2015-12-07 DIAGNOSIS — R600 Localized edema: Secondary | ICD-10-CM | POA: Diagnosis not present

## 2015-12-07 DIAGNOSIS — M7989 Other specified soft tissue disorders: Secondary | ICD-10-CM | POA: Diagnosis not present

## 2015-12-07 DIAGNOSIS — M25572 Pain in left ankle and joints of left foot: Secondary | ICD-10-CM | POA: Diagnosis not present

## 2015-12-07 DIAGNOSIS — M25472 Effusion, left ankle: Secondary | ICD-10-CM

## 2015-12-07 DIAGNOSIS — S99912A Unspecified injury of left ankle, initial encounter: Secondary | ICD-10-CM

## 2015-12-07 DIAGNOSIS — S99922A Unspecified injury of left foot, initial encounter: Secondary | ICD-10-CM

## 2015-12-08 ENCOUNTER — Other Ambulatory Visit: Payer: Self-pay | Admitting: Family Medicine

## 2015-12-08 DIAGNOSIS — M94272 Chondromalacia, left ankle and joints of left foot: Secondary | ICD-10-CM

## 2015-12-15 ENCOUNTER — Encounter: Payer: Self-pay | Admitting: Family Medicine

## 2015-12-15 ENCOUNTER — Ambulatory Visit (INDEPENDENT_AMBULATORY_CARE_PROVIDER_SITE_OTHER): Payer: Medicare Other | Admitting: Family Medicine

## 2015-12-15 VITALS — BP 124/82 | HR 70 | Temp 98.1°F | Resp 16 | Ht 68.0 in | Wt 223.0 lb

## 2015-12-15 DIAGNOSIS — J069 Acute upper respiratory infection, unspecified: Secondary | ICD-10-CM | POA: Diagnosis not present

## 2015-12-15 MED ORDER — DOXYCYCLINE HYCLATE 100 MG PO TABS: 100.0000 mg | ORAL_TABLET | Freq: Two times a day (BID) | ORAL | Status: DC

## 2015-12-15 MED ORDER — BENZONATATE 100 MG PO CAPS: 100.0000 mg | ORAL_CAPSULE | Freq: Three times a day (TID) | ORAL | Status: DC | PRN

## 2015-12-15 MED ORDER — DM-GUAIFENESIN ER 30-600 MG PO TB12: 1.0000 | ORAL_TABLET | Freq: Two times a day (BID) | ORAL | Status: DC

## 2015-12-15 MED ORDER — PSEUDOEPHEDRINE HCL 30 MG PO CAPS: 1.0000 | ORAL_CAPSULE | Freq: Three times a day (TID) | ORAL | Status: DC | PRN

## 2015-12-15 NOTE — Patient Instructions (Addendum)
Your symptoms are consistent with a viral upper respiratory infection. At this time there is no need for antibiotics.  If your symptoms persist for > 10 days or get better and than worsen please let me know. You may have a secondary bacterial infection.  You can use supportive care at home to help with your symptoms. I have sent Mucinex DM to your pharmacy to help break up the congestion and soothe your cough. You can takes this twice daily.  I have also sent tesslon perles to your pharmacy to help with the cough- you can take these 3 times daily as needed. Honey is a natural cough suppressant- so add it to your tea in the morning.  If you have a humidifer, set that up in your bedroom at night.   Fill the antibiotic on Saturday if symptoms do not improve.  Use salt water gargles 4 times daily for sore throat.

## 2015-12-15 NOTE — Progress Notes (Signed)
Subjective:    Patient ID: Dawn Baldwin, female    DOB: 07/12/1965, 50 y.o.   MRN: [id number]  HPI: Dawn Squibbnnie Krah is a 50 y.o. female presenting on 12/15/2015 for Cough   HPI  Pt presents for cough and congestion. Symptoms started 3 days ago. No fevers. Night sweats. Cough is productive of yellow sputum. Sore throat. No facial pain or pressure. No ear pain. No chest pain, tightness, or heaviness. No trouble breathing.   Past Medical History  Diagnosis Date  . Allergy   . Bipolar affective disorder (HCC)   . GERD (gastroesophageal reflux disease)   . Hypertension   . Anemia   . Depression     Current Outpatient Prescriptions on File Prior to Visit  Medication Sig  . amLODipine (NORVASC) 10 MG tablet Take 1 tablet (10 mg total) by mouth daily.  . Blood Pressure Monitoring (BLOOD PRESSURE CUFF) MISC 1 each by Does not apply route daily.  . cyclobenzaprine (FLEXERIL) 10 MG tablet Take 1 tablet (10 mg total) by mouth 3 (three) times daily as needed for muscle spasms.  . meloxicam (MOBIC) 15 MG tablet Take 1 tablet (15 mg total) by mouth daily.  . QUEtiapine (SEROQUEL) 100 MG tablet Take 100 mg by mouth at bedtime.   No current facility-administered medications on file prior to visit.    Review of Systems  Constitutional: Negative for fever and chills.  HENT: Positive for congestion, postnasal drip and sore throat. Negative for ear pain, sinus pressure and sneezing.   Respiratory: Positive for cough. Negative for chest tightness and wheezing.   Cardiovascular: Negative for chest pain and palpitations.  Gastrointestinal: Negative.  Negative for nausea, vomiting and diarrhea.  Musculoskeletal: Negative for neck pain and neck stiffness.  Neurological: Negative for headaches.   Per HPI unless specifically indicated above     Objective:    BP 124/82 mmHg  Pulse 70  Temp(Src) 98.1 F (36.7 C) (Oral)  Resp 16  Ht 5\' 8"  (1.727 m)  Wt 223 lb (101.152 kg)  BMI 33.91 kg/m2  SpO2  98%  Wt Readings from Last 3 Encounters:  12/15/15 223 lb (101.152 kg)  11/14/15 230 lb (104.327 kg)  10/27/15 226 lb (102.513 kg)    Physical Exam  Constitutional: She is oriented to person, place, and time. She appears well-developed and well-nourished.  HENT:  Head: Normocephalic and atraumatic.  Right Ear: Hearing normal. A middle ear effusion is present.  Left Ear:  No middle ear effusion.  Nose: Mucosal edema and rhinorrhea present. Right sinus exhibits no maxillary sinus tenderness and no frontal sinus tenderness. Left sinus exhibits no maxillary sinus tenderness and no frontal sinus tenderness.  Mouth/Throat: Uvula is midline and mucous membranes are normal. Posterior oropharyngeal erythema present. No oropharyngeal exudate, posterior oropharyngeal edema or tonsillar abscesses.  Drainage seen in oropharynx  Neck: Trachea normal, normal range of motion and full passive range of motion without pain. Neck supple. No muscular tenderness present. No rigidity. No edema present. No Brudzinski's sign and no Kernig's sign noted.  Cardiovascular: Normal rate, regular rhythm and normal heart sounds.  Exam reveals no gallop and no friction rub.   No murmur heard. Pulmonary/Chest: Effort normal and breath sounds normal. She has no wheezes. She exhibits no tenderness.  Abdominal: Soft. Normal appearance and bowel sounds are normal. She exhibits no distension and no mass. There is no tenderness. There is no rebound and no guarding.  Musculoskeletal: Normal range of motion. She exhibits no edema or  tenderness.  Lymphadenopathy:       Head (right side): No submental, no submandibular and no tonsillar adenopathy present.       Head (left side): No submental, no submandibular and no tonsillar adenopathy present.    She has cervical adenopathy.       Right cervical: Superficial cervical adenopathy present.       Left cervical: Superficial cervical adenopathy present.  Neurological: She is alert and  oriented to person, place, and time.  Skin: Skin is warm and dry.   Results for orders placed or performed in visit on 09/19/15  POCT urinalysis dipstick  Result Value Ref Range   Color, UA yellow    Clarity, UA clear    Glucose, UA negative    Bilirubin, UA negative    Ketones, UA negative    Spec Grav, UA 1.015    Blood, UA negative    pH, UA 5.0    Protein, UA negative    Urobilinogen, UA negative    Nitrite, UA negative    Leukocytes, UA Negative Negative      Assessment & Plan:   Problem List Items Addressed This Visit    None    Visit Diagnoses    Upper respiratory infection    -  Primary    Treat for URI. Supportive care. Abx indications reviewed. paper prescription given to fill if needed. Alarm symptoms reviewed. Return precautions reviewed.     Relevant Medications    dextromethorphan-guaiFENesin (MUCINEX DM) 30-600 MG 12hr tablet    benzonatate (TESSALON) 100 MG capsule    Pseudoephedrine HCl 30 MG CAPS    doxycycline (VIBRA-TABS) 100 MG tablet       Meds ordered this encounter  Medications  . dextromethorphan-guaiFENesin (MUCINEX DM) 30-600 MG 12hr tablet    Sig: Take 1 tablet by mouth 2 (two) times daily.    Dispense:  20 tablet    Refill:  0    Order Specific Question:  Supervising Provider    Answer:  Janeann ForehandHAWKINS JR, JAMES H [phone][[id number]]  . benzonatate (TESSALON) 100 MG capsule    Sig: Take 1 capsule (100 mg total) by mouth 3 (three) times daily as needed.    Dispense:  30 capsule    Refill:  0    Order Specific Question:  Supervising Provider    Answer:  Janeann ForehandHAWKINS JR, JAMES H [[id number]]  . Pseudoephedrine HCl 30 MG CAPS    Sig: Take 1 capsule by mouth 3 (three) times daily as needed.    Dispense:  30 capsule    Refill:  0    Order Specific Question:  Supervising Provider    Answer:  Janeann ForehandHAWKINS JR, JAMES H [[id number]]  . doxycycline (VIBRA-TABS) 100 MG tablet    Sig: Take 1 tablet (100 mg total) by mouth 2 (two) times daily.    Dispense:  14 tablet    Refill:  0     Order Specific Question:  Supervising Provider    Answer:  Janeann ForehandHAWKINS JR, JAMES H [[id number]]      Follow up plan: Return if symptoms worsen or fail to improve.

## 2015-12-19 DIAGNOSIS — S93422A Sprain of deltoid ligament of left ankle, initial encounter: Secondary | ICD-10-CM | POA: Diagnosis not present

## 2015-12-19 DIAGNOSIS — M65872 Other synovitis and tenosynovitis, left ankle and foot: Secondary | ICD-10-CM | POA: Diagnosis not present

## 2015-12-25 DIAGNOSIS — F17210 Nicotine dependence, cigarettes, uncomplicated: Secondary | ICD-10-CM | POA: Diagnosis not present

## 2015-12-25 DIAGNOSIS — S60811A Abrasion of right wrist, initial encounter: Secondary | ICD-10-CM | POA: Diagnosis not present

## 2015-12-25 DIAGNOSIS — S299XXA Unspecified injury of thorax, initial encounter: Secondary | ICD-10-CM | POA: Diagnosis not present

## 2015-12-25 DIAGNOSIS — M549 Dorsalgia, unspecified: Secondary | ICD-10-CM | POA: Diagnosis not present

## 2015-12-29 ENCOUNTER — Encounter: Payer: Self-pay | Admitting: Emergency Medicine

## 2015-12-29 ENCOUNTER — Emergency Department: Payer: No Typology Code available for payment source

## 2015-12-29 ENCOUNTER — Emergency Department
Admission: EM | Admit: 2015-12-29 | Discharge: 2015-12-29 | Disposition: A | Discharge status: Home / Self Care | Payer: No Typology Code available for payment source | Attending: Student | Admitting: Student

## 2015-12-29 DIAGNOSIS — S20219D Contusion of unspecified front wall of thorax, subsequent encounter: Secondary | ICD-10-CM

## 2015-12-29 DIAGNOSIS — S161XXA Strain of muscle, fascia and tendon at neck level, initial encounter: Secondary | ICD-10-CM | POA: Diagnosis not present

## 2015-12-29 DIAGNOSIS — S66911S Strain of unspecified muscle, fascia and tendon at wrist and hand level, right hand, sequela: Secondary | ICD-10-CM

## 2015-12-29 DIAGNOSIS — S56311A Strain of extensor or abductor muscles, fascia and tendons of right thumb at forearm level, initial encounter: Secondary | ICD-10-CM | POA: Diagnosis not present

## 2015-12-29 DIAGNOSIS — F319 Bipolar disorder, unspecified: Secondary | ICD-10-CM | POA: Diagnosis not present

## 2015-12-29 DIAGNOSIS — IMO0001 Reserved for inherently not codable concepts without codable children: Secondary | ICD-10-CM

## 2015-12-29 DIAGNOSIS — M542 Cervicalgia: Secondary | ICD-10-CM | POA: Diagnosis present

## 2015-12-29 DIAGNOSIS — S20219A Contusion of unspecified front wall of thorax, initial encounter: Secondary | ICD-10-CM | POA: Insufficient documentation

## 2015-12-29 DIAGNOSIS — Y9389 Activity, other specified: Secondary | ICD-10-CM | POA: Insufficient documentation

## 2015-12-29 DIAGNOSIS — S39012A Strain of muscle, fascia and tendon of lower back, initial encounter: Secondary | ICD-10-CM | POA: Insufficient documentation

## 2015-12-29 DIAGNOSIS — Y92410 Unspecified street and highway as the place of occurrence of the external cause: Secondary | ICD-10-CM | POA: Diagnosis not present

## 2015-12-29 DIAGNOSIS — S161XXS Strain of muscle, fascia and tendon at neck level, sequela: Secondary | ICD-10-CM

## 2015-12-29 DIAGNOSIS — S199XXA Unspecified injury of neck, initial encounter: Secondary | ICD-10-CM | POA: Diagnosis not present

## 2015-12-29 DIAGNOSIS — Z87891 Personal history of nicotine dependence: Secondary | ICD-10-CM | POA: Diagnosis not present

## 2015-12-29 DIAGNOSIS — M545 Low back pain: Secondary | ICD-10-CM | POA: Diagnosis not present

## 2015-12-29 DIAGNOSIS — S39012D Strain of muscle, fascia and tendon of lower back, subsequent encounter: Secondary | ICD-10-CM

## 2015-12-29 DIAGNOSIS — I10 Essential (primary) hypertension: Secondary | ICD-10-CM | POA: Insufficient documentation

## 2015-12-29 DIAGNOSIS — Y999 Unspecified external cause status: Secondary | ICD-10-CM | POA: Diagnosis not present

## 2015-12-29 DIAGNOSIS — S3992XA Unspecified injury of lower back, initial encounter: Secondary | ICD-10-CM | POA: Diagnosis not present

## 2015-12-29 MED ORDER — METHOCARBAMOL 750 MG PO TABS: 750.0000 mg | ORAL_TABLET | Freq: Four times a day (QID) | ORAL | Status: DC

## 2015-12-29 MED ORDER — TRAMADOL HCL 50 MG PO TABS: 50.0000 mg | ORAL_TABLET | Freq: Four times a day (QID) | ORAL | Status: DC | PRN
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
## 2016-01-03 ENCOUNTER — Ambulatory Visit (INDEPENDENT_AMBULATORY_CARE_PROVIDER_SITE_OTHER): Payer: Medicare Other | Admitting: Family Medicine

## 2016-01-03 DIAGNOSIS — S161XXD Strain of muscle, fascia and tendon at neck level, subsequent encounter: Secondary | ICD-10-CM

## 2016-01-03 DIAGNOSIS — R0789 Other chest pain: Secondary | ICD-10-CM | POA: Diagnosis not present
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
## 2016-01-05 ENCOUNTER — Telehealth: Payer: Self-pay | Admitting: Family Medicine
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
## 2016-01-05 NOTE — Telephone Encounter (Signed)
Pt would like to go back to work on Monday July 17th with no restrictions.  Her call back number is [phone]

## 2016-01-13 ENCOUNTER — Telehealth: Payer: Self-pay | Admitting: Family Medicine

## 2016-01-13 DIAGNOSIS — B86 Scabies: Secondary | ICD-10-CM

## 2016-01-13 MED ORDER — PERMETHRIN 5 % EX CREA: 1.0000 "application " | TOPICAL_CREAM | Freq: Once | CUTANEOUS | Status: DC

## 2016-01-13 NOTE — Addendum Note (Signed)
Addended byLaroy Apple: KREBS, AMY L on: 01/13/2016 04:45 PM   Modules accepted: Orders

## 2016-01-13 NOTE — Telephone Encounter (Signed)
Medication sent to pharmacy on file.  

## 2016-01-13 NOTE — Telephone Encounter (Signed)
As per pt her husband diagnosed with scabies wanted to send RX for it.

## 2016-01-16 DIAGNOSIS — M6530 Trigger finger, unspecified finger: Secondary | ICD-10-CM | POA: Diagnosis not present

## 2016-01-16 DIAGNOSIS — M76829 Posterior tibial tendinitis, unspecified leg: Secondary | ICD-10-CM | POA: Diagnosis not present

## 2016-01-17 ENCOUNTER — Other Ambulatory Visit: Payer: Self-pay | Admitting: Family Medicine

## 2016-01-17 DIAGNOSIS — Z1231 Encounter for screening mammogram for malignant neoplasm of breast: Secondary | ICD-10-CM

## 2016-01-17 DIAGNOSIS — Z1239 Encounter for other screening for malignant neoplasm of breast: Secondary | ICD-10-CM

## 2016-01-17 DIAGNOSIS — Z0000 Encounter for general adult medical examination without abnormal findings: Secondary | ICD-10-CM

## 2016-01-23 ENCOUNTER — Ambulatory Visit: Payer: Self-pay | Admitting: Family Medicine

## 2016-01-25 ENCOUNTER — Encounter: Payer: Self-pay | Admitting: Family Medicine

## 2016-01-25 ENCOUNTER — Ambulatory Visit (INDEPENDENT_AMBULATORY_CARE_PROVIDER_SITE_OTHER): Payer: Medicare Other | Admitting: Family Medicine

## 2016-01-25 VITALS — BP 149/92 | HR 82 | Temp 97.8°F | Resp 16 | Ht 68.0 in | Wt 227.0 lb

## 2016-01-25 DIAGNOSIS — R351 Nocturia: Secondary | ICD-10-CM

## 2016-01-25 DIAGNOSIS — N951 Menopausal and female climacteric states: Secondary | ICD-10-CM | POA: Diagnosis not present

## 2016-01-25 DIAGNOSIS — R6889 Other general symptoms and signs: Secondary | ICD-10-CM

## 2016-01-25 LAB — COMPLETE METABOLIC PANEL WITH GFR
ALT: 23 U/L (ref 6–29)
AST: 17 U/L (ref 10–35)
Albumin: 4.2 g/dL (ref 3.6–5.1)
Alkaline Phosphatase: 52 U/L (ref 33–115)
BUN: 13 mg/dL (ref 7–25)
CO2: 29 mmol/L (ref 20–31)
Calcium: 9.7 mg/dL (ref 8.6–10.2)
Chloride: 104 mmol/L (ref 98–110)
Creat: 0.79 mg/dL (ref 0.50–1.10)
GFR, Est African American: >89 mL/min (ref >=60)
GFR, Est Non African American: 89 mL/min (ref >=60)
Glucose, Bld: 57 mg/dL — ABNORMAL LOW (ref 65–99)
Potassium: 4.1 mmol/L (ref 3.5–5.3)
SODIUM: 141 mmol/L (ref 135–146)
TOTAL PROTEIN: 7 g/dL (ref 6.1–8.1)
Total Bilirubin: 0.4 mg/dL (ref 0.2–1.2)

## 2016-01-25 LAB — POCT URINALYSIS DIPSTICK
Bilirubin, UA: NEGATIVE
Glucose, UA: NEGATIVE
Ketones, UA: NEGATIVE
Leukocytes, UA: NEGATIVE
Nitrite, UA: NEGATIVE
PH UA: 5
PROTEIN UA: NEGATIVE
RBC UA: NEGATIVE
SPEC GRAV UA: 1.015
UROBILINOGEN UA: NEGATIVE

## 2016-01-25 LAB — TSH: TSH: 2.45 m[IU]/L

## 2016-01-25 LAB — LUTEINIZING HORMONE: LH: 43.3 m[IU]/mL

## 2016-01-25 LAB — FOLLICLE STIMULATING HORMONE: FSH: 60.9 m[IU]/mL

## 2016-01-25 MED ORDER — GABAPENTIN 100 MG PO CAPS: 200.0000 mg | ORAL_CAPSULE | Freq: Every day | ORAL | 11 refills | Status: DC

## 2016-01-25 NOTE — Progress Notes (Signed)
Subjective:    Patient ID: Dawn Baldwin, female    DOB: 1965-10-22, 50 y.o.   MRN: [id number]  HPI: Dawn Baldwin is a 50 y.o. female presenting on 01/25/2016 for Hot Flashes   HPI   Pt is having hot flashes. Trouble sleeping at night. Due to feeling hot. Has tried estroven and black cohash. Not helpful. Hot flashes are increasing. Tried lose clothing.  Pt is also concerned voiding at night. She is taking norvasc once daily to help with BP.   Past Medical History:  Diagnosis Date  . Allergy   . Anemia   . Bipolar affective disorder (HCC)   . Depression   . GERD (gastroesophageal reflux disease)   . Hypertension     Current Outpatient Prescriptions on File Prior to Visit  Medication Sig  . amLODipine (NORVASC) 10 MG tablet Take 1 tablet (10 mg total) by mouth daily.  . Blood Pressure Monitoring (BLOOD PRESSURE CUFF) MISC 1 each by Does not apply route daily.  . meloxicam (MOBIC) 15 MG tablet Take 1 tablet (15 mg total) by mouth daily.  . QUEtiapine (SEROQUEL) 100 MG tablet Take 100 mg by mouth at bedtime.  . cyclobenzaprine (FLEXERIL) 10 MG tablet Take 1 tablet (10 mg total) by mouth 3 (three) times daily as needed for muscle spasms. (Patient not taking: Reported on 01/25/2016)  . methocarbamol (ROBAXIN-750) 750 MG tablet Take 1 tablet (750 mg total) by mouth 4 (four) times daily. (Patient not taking: Reported on 01/25/2016)  . permethrin (ELIMITE) 5 % cream Apply 1 application topically once. Apply from neck to toes. Allow to sit for 8 hours and the wash off. (Patient not taking: Reported on 01/25/2016)  . traMADol (ULTRAM) 50 MG tablet Take 1 tablet (50 mg total) by mouth every 6 (six) hours as needed. (Patient not taking: Reported on 01/25/2016)   No current facility-administered medications on file prior to visit.     Review of Systems  Constitutional: Negative for chills and fever.  HENT: Negative.   Respiratory: Negative for cough, chest tightness and wheezing.   Cardiovascular:  Negative for chest pain and leg swelling.  Gastrointestinal: Negative for abdominal pain, constipation, diarrhea, nausea and vomiting.  Endocrine: Positive for heat intolerance. Negative for cold intolerance, polydipsia, polyphagia and polyuria.  Genitourinary: Negative for difficulty urinating and dysuria.  Musculoskeletal: Negative.   Neurological: Negative for dizziness, light-headedness and numbness.  Psychiatric/Behavioral: Positive for sleep disturbance.   Per HPI unless specifically indicated above     Objective:    BP (!) 149/92 (BP Location: Left Arm, Patient Position: Sitting, Cuff Size: Large)   Pulse 82   Temp 97.8 F (36.6 C) (Oral)   Resp 16   Ht 5\' 8"  (1.727 m)   Wt 227 lb (103 kg)   LMP 12/10/2015   BMI 34.52 kg/m   Wt Readings from Last 3 Encounters:  01/25/16 227 lb (103 kg)  01/03/16 224 lb 9.6 oz (101.9 kg)  12/29/15 223 lb (101.2 kg)    Physical Exam  Constitutional: She is oriented to person, place, and time. She appears well-developed and well-nourished.  HENT:  Head: Normocephalic and atraumatic.  Neck: Neck supple.  Cardiovascular: Normal rate, regular rhythm and normal heart sounds.  Exam reveals no gallop and no friction rub.   No murmur heard. Pulmonary/Chest: Effort normal and breath sounds normal. She has no wheezes. She exhibits no tenderness.  Abdominal: Soft. Normal appearance and bowel sounds are normal. She exhibits no distension and no mass.  There is no tenderness. There is no rebound and no guarding.  Musculoskeletal: Normal range of motion. She exhibits no edema or tenderness.  Lymphadenopathy:    She has no cervical adenopathy.  Neurological: She is alert and oriented to person, place, and time.  Skin: Skin is warm and dry.   Results for orders placed or performed in visit on 01/25/16  COMPLETE METABOLIC PANEL WITH GFR  Result Value Ref Range   Sodium 141 135 - 146 mmol/L   Potassium 4.1 3.5 - 5.3 mmol/L   Chloride 104 98 - 110  mmol/L   CO2 29 20 - 31 mmol/L   Glucose, Bld 57 (L) 65 - 99 mg/dL   BUN 13 7 - 25 mg/dL   Creat 1.61 0.96 - 0.45 mg/dL   Total Bilirubin 0.4 0.2 - 1.2 mg/dL   Alkaline Phosphatase 52 33 - 115 U/L   AST 17 10 - 35 U/L   ALT 23 6 - 29 U/L   Total Protein 7.0 6.1 - 8.1 g/dL   Albumin 4.2 3.6 - 5.1 g/dL   Calcium 9.7 8.6 - 40.9 mg/dL   GFR, Est African American >89 >=60 mL/min   GFR, Est Non African American 89 >=60 mL/min  FSH  Result Value Ref Range   FSH 60.9 mIU/mL  LH  Result Value Ref Range   LH 43.3 mIU/mL  TSH  Result Value Ref Range   TSH 2.45 mIU/L  POCT urinalysis dipstick  Result Value Ref Range   Color, UA yellow    Clarity, UA clear    Glucose, UA negative    Bilirubin, UA negative    Ketones, UA negative    Spec Grav, UA 1.015    Blood, UA negative    pH, UA 5.0    Protein, UA negative    Urobilinogen, UA negative    Nitrite, UA negative    Leukocytes, UA Negative Negative      Assessment & Plan:   Problem List Items Addressed This Visit    None    Visit Diagnoses    Menopausal syndrome (hot flashes)    -  Primary   Trial of gabapentin once daily at night time to help with hot flash symptoms. Reviewed non-pharm measures. Recheck 1 mos.    Relevant Medications   gabapentin (NEURONTIN) 100 MG capsule   Other Relevant Orders   FSH (Completed)   LH (Completed)   Heat intolerance       Check TSH, but likley menopausal.    Relevant Orders   COMPLETE METABOLIC PANEL WITH GFR (Completed)   TSH (Completed)   Nocturia       Likely 2/2 amlopdipine. UA negative. Will consider changing BP medication if it becomes more bothersome.    Relevant Orders   POCT urinalysis dipstick (Completed)      Meds ordered this encounter  Medications  . ibuprofen (ADVIL,MOTRIN) 600 MG tablet    Sig: Take 600 mg by mouth daily as needed.  . gabapentin (NEURONTIN) 100 MG capsule    Sig: Take 2 capsules (200 mg total) by mouth at bedtime.    Dispense:  60 capsule     Refill:  11    Order Specific Question:   Supervising Provider    Answer:   Janeann Forehand [phone]      Follow up plan: Return in about 4 weeks (around 02/22/2016) for hot flashes. Marland Kitchen

## 2016-01-25 NOTE — Patient Instructions (Addendum)
We will try gabapentin to help with hot flashes. Take 100mg  capsules at bedtime for 1 week then increase to 2 capsules if needed.   Perimenopause Perimenopause is the time when your body begins to move into the menopause (no menstrual period for 12 straight months). It is a natural process. Perimenopause can begin 2-8 years before the menopause and usually lasts for 1 year after the menopause. During this time, your ovaries may or may not produce an egg. The ovaries vary in their production of estrogen and progesterone hormones each month. This can cause irregular menstrual periods, difficulty getting pregnant, vaginal bleeding between periods, and uncomfortable symptoms. CAUSES  Irregular production of the ovarian hormones, estrogen and progesterone, and not ovulating every month.  Other causes include:  Tumor of the pituitary gland in the brain.  Medical disease that affects the ovaries.  Radiation treatment.  Chemotherapy.  Unknown causes.  Heavy smoking and excessive alcohol intake can bring on perimenopause sooner. SIGNS AND SYMPTOMS   Hot flashes.  Night sweats.  Irregular menstrual periods.  Decreased sex drive.  Vaginal dryness.  Headaches.  Mood swings.  Depression.  Memory problems.  Irritability.  Tiredness.  Weight gain.  Trouble getting pregnant.  The beginning of losing bone cells (osteoporosis).  The beginning of hardening of the arteries (atherosclerosis). DIAGNOSIS  Your health care provider will make a diagnosis by analyzing your age, menstrual history, and symptoms. He or she will do a physical exam and note any changes in your body, especially your female organs. Female hormone tests may or may not be helpful depending on the amount of female hormones you produce and when you produce them. However, other hormone tests may be helpful to rule out other problems. TREATMENT  In some cases, no treatment is needed. The decision on whether treatment  is necessary during the perimenopause should be made by you and your health care provider based on how the symptoms are affecting you and your lifestyle. Various treatments are available, such as:  Treating individual symptoms with a specific medicine for that symptom.  Herbal medicines that can help specific symptoms.  Counseling.  Group therapy. HOME CARE INSTRUCTIONS   Keep track of your menstrual periods (when they occur, how heavy they are, how long between periods, and how long they last) as well as your symptoms and when they started.  Only take over-the-counter or prescription medicines as directed by your health care provider.  Sleep and rest.  Exercise.  Eat a diet that contains calcium (good for your bones) and soy (acts like the estrogen hormone).  Do not smoke.  Avoid alcoholic beverages.  Take vitamin supplements as recommended by your health care provider. Taking vitamin E may help in certain cases.  Take calcium and vitamin D supplements to help prevent bone loss.  Group therapy is sometimes helpful.  Acupuncture may help in some cases. SEEK MEDICAL CARE IF:   You have questions about any symptoms you are having.  You need a referral to a specialist (gynecologist, psychiatrist, or psychologist). SEEK IMMEDIATE MEDICAL CARE IF:   You have vaginal bleeding.  Your period lasts longer than 8 days.  Your periods are recurring sooner than 21 days.  You have bleeding after intercourse.  You have severe depression.  You have pain when you urinate.  You have severe headaches.  You have vision problems.   This information is not intended to replace advice given to you by your health care provider. Make sure you discuss any  questions you have with your health care provider.   Document Released: 07/19/2004 Document Revised: 07/02/2014 Document Reviewed: 01/08/2013 Elsevier Interactive Patient Education 2016 [employer]vier Inc.  

## 2016-02-01 ENCOUNTER — Telehealth: Payer: Self-pay | Admitting: Family Medicine

## 2016-02-01 NOTE — Telephone Encounter (Signed)
Pt asked if it was time for yearly physical with pap.  Her call back number is [phone]

## 2016-02-01 NOTE — Telephone Encounter (Signed)
Pt's last pap smear was 2015 WNL with other physician and her order for mammogram is already placed and card was given to her to schedule appointment at her convenient time.

## 2016-02-06 DIAGNOSIS — M76822 Posterior tibial tendinitis, left leg: Secondary | ICD-10-CM | POA: Diagnosis not present

## 2016-02-06 DIAGNOSIS — M25572 Pain in left ankle and joints of left foot: Secondary | ICD-10-CM | POA: Diagnosis not present

## 2016-02-08 DIAGNOSIS — M25572 Pain in left ankle and joints of left foot: Secondary | ICD-10-CM | POA: Diagnosis not present

## 2016-02-08 DIAGNOSIS — M76822 Posterior tibial tendinitis, left leg: Secondary | ICD-10-CM | POA: Diagnosis not present

## 2016-02-13 DIAGNOSIS — M76822 Posterior tibial tendinitis, left leg: Secondary | ICD-10-CM | POA: Diagnosis not present

## 2016-02-13 DIAGNOSIS — M25572 Pain in left ankle and joints of left foot: Secondary | ICD-10-CM | POA: Diagnosis not present

## 2016-02-15 DIAGNOSIS — M76822 Posterior tibial tendinitis, left leg: Secondary | ICD-10-CM | POA: Diagnosis not present

## 2016-02-15 DIAGNOSIS — M25572 Pain in left ankle and joints of left foot: Secondary | ICD-10-CM | POA: Diagnosis not present

## 2016-02-20 DIAGNOSIS — M76822 Posterior tibial tendinitis, left leg: Secondary | ICD-10-CM | POA: Diagnosis not present

## 2016-02-20 DIAGNOSIS — M25572 Pain in left ankle and joints of left foot: Secondary | ICD-10-CM | POA: Diagnosis not present

## 2016-02-23 DIAGNOSIS — M25572 Pain in left ankle and joints of left foot: Secondary | ICD-10-CM | POA: Diagnosis not present

## 2016-02-23 DIAGNOSIS — M76822 Posterior tibial tendinitis, left leg: Secondary | ICD-10-CM | POA: Diagnosis not present

## 2016-02-28 ENCOUNTER — Emergency Department
Admission: EM | Admit: 2016-02-28 | Discharge: 2016-02-28 | Disposition: A | Discharge status: Home / Self Care | Payer: Medicare Other | Attending: Emergency Medicine | Admitting: Emergency Medicine

## 2016-02-28 ENCOUNTER — Encounter: Payer: Self-pay | Admitting: *Deleted

## 2016-02-28 DIAGNOSIS — Y929 Unspecified place or not applicable: Secondary | ICD-10-CM | POA: Diagnosis not present

## 2016-02-28 DIAGNOSIS — Y990 Civilian activity done for income or pay: Secondary | ICD-10-CM | POA: Diagnosis not present

## 2016-02-28 DIAGNOSIS — Z87891 Personal history of nicotine dependence: Secondary | ICD-10-CM | POA: Diagnosis not present

## 2016-02-28 DIAGNOSIS — Z79899 Other long term (current) drug therapy: Secondary | ICD-10-CM | POA: Diagnosis not present

## 2016-02-28 DIAGNOSIS — I10 Essential (primary) hypertension: Secondary | ICD-10-CM | POA: Insufficient documentation

## 2016-02-28 DIAGNOSIS — S46111A Strain of muscle, fascia and tendon of long head of biceps, right arm, initial encounter: Secondary | ICD-10-CM | POA: Insufficient documentation

## 2016-02-28 DIAGNOSIS — X500XXA Overexertion from strenuous movement or load, initial encounter: Secondary | ICD-10-CM | POA: Diagnosis not present

## 2016-02-28 DIAGNOSIS — S4991XA Unspecified injury of right shoulder and upper arm, initial encounter: Secondary | ICD-10-CM | POA: Diagnosis present

## 2016-02-28 DIAGNOSIS — S46101A Unspecified injury of muscle, fascia and tendon of long head of biceps, right arm, initial encounter: Secondary | ICD-10-CM | POA: Diagnosis not present

## 2016-02-28 DIAGNOSIS — Y9389 Activity, other specified: Secondary | ICD-10-CM | POA: Insufficient documentation

## 2016-02-28 DIAGNOSIS — S46211A Strain of muscle, fascia and tendon of other parts of biceps, right arm, initial encounter: Secondary | ICD-10-CM

## 2016-02-28 DIAGNOSIS — F310 Bipolar disorder, current episode hypomanic: Secondary | ICD-10-CM | POA: Diagnosis not present

## 2016-02-28 MED ORDER — KETOROLAC TROMETHAMINE 60 MG/2ML IM SOLN
60.0000 mg | Freq: Once | INTRAMUSCULAR | Status: AC
  Administered 2016-02-28: 60 mg via INTRAMUSCULAR
  Filled 2016-02-28: qty 2

## 2016-02-28 MED ORDER — DICLOFENAC SODIUM 75 MG PO TBEC: 75.0000 mg | DELAYED_RELEASE_TABLET | Freq: Two times a day (BID) | ORAL | 0 refills | Status: DC

## 2016-02-28 MED ORDER — DIAZEPAM 5 MG PO TABS
5.0000 mg | ORAL_TABLET | Freq: Once | ORAL | Status: AC
  Administered 2016-02-28: 5 mg via ORAL
  Filled 2016-02-28: qty 1

## 2016-02-28 MED ORDER — TIZANIDINE HCL 4 MG PO CAPS: 4.0000 mg | ORAL_CAPSULE | Freq: Four times a day (QID) | ORAL | 0 refills | Status: DC | PRN

## 2016-02-28 NOTE — ED Provider Notes (Signed)
[facility] Emergency Department Provider Note  ____________________________________________  Time seen: Approximately 9:17 PM  I have reviewed the triage vital signs and the nursing notes.   HISTORY  Chief Complaint Arm Pain    HPI Dawn Baldwin is a 50 y.o. female who presents for evaluation of right upper bicep pain. 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 Complains of right upper muscular arm pain. Denies any direct trauma to the arm itself.   Past Medical History:  Diagnosis Date  . Allergy   . Anemia   . Bipolar affective disorder (HCC)   . Depression   . GERD (gastroesophageal reflux disease)   . Hypertension     Patient Active Problem List   Diagnosis Date Noted  . Hot flushes, perimenopausal 06/09/2015  . Hypertension 06/03/2015  . Bipolar affective disorder (HCC) 03/10/2015  . Allergic rhinitis 03/10/2015  . Smoker 03/10/2015  . Obesity (BMI 30-39.9) 03/10/2015    History reviewed. No pertinent surgical history.  Prior to Admission medications   Medication Sig Start Date End Date Taking? Authorizing Provider  amLODipine (NORVASC) 10 MG tablet Take 1 tablet (10 mg total) by mouth daily. 06/09/15   Amy Rusty Aus, NP  Blood Pressure Monitoring (BLOOD PRESSURE CUFF) MISC 1 each by Does not apply route daily. 06/03/15   Amy Rusty Aus, NP  diclofenac (VOLTAREN) 75 MG EC tablet Take 1 tablet (75 mg total) by mouth 2 (two) times daily. 02/28/16   Charmayne Sheer Haizlee Henton, PA-C  gabapentin (NEURONTIN) 100 MG capsule Take 2 capsules (200 mg total) by mouth at bedtime. 01/25/16   Amy Rusty Aus, NP  QUEtiapine (SEROQUEL) 100 MG tablet Take 100 mg by mouth at bedtime. 08/16/15   Historical Provider, MD  tiZANidine (ZANAFLEX) 4 MG capsule Take 1 capsule (4 mg total) by mouth 4 (four) times daily as needed for muscle spasms. 02/28/16   Evangeline Dakin, PA-C    Allergies Review of  patient's allergies indicates no known allergies.  Family History  Problem Relation Age of Onset  . Hypertension Mother     Social History Social History  Substance Use Topics  . Smoking status: Former Smoker    Packs/day: 1.00    Types: Cigars    Quit date: 03/26/2015  . Smokeless tobacco: Never Used  . Alcohol use Yes     Comment: 2 cans of beer week    Review of Systems Constitutional: No fever/chills Cardiovascular: Denies chest pain. Respiratory: Denies shortness of breath. Musculoskeletal: Positive for right upper arm pain. Skin: Negative for rash. Neurological: Negative for headaches, focal weakness or numbness.  10-point ROS otherwise negative.  ____________________________________________   PHYSICAL EXAM:  VITAL SIGNS: ED Triage Vitals  Enc Vitals Group     BP 02/28/16 2110 122/89     Pulse Rate 02/28/16 2110 76     Resp 02/28/16 2110 20     Temp 02/28/16 2110 98.6 F (37 C)     Temp Source 02/28/16 2110 Oral     SpO2 02/28/16 2110 97 %     Weight 02/28/16 2111 234 lb (106.1 kg)     Height 02/28/16 2111 5\' 7"  (1.702 m)     Head Circumference --      Peak Flow --      Pain Score 02/28/16 2112 9     Pain Loc --      Pain Edu? --  Excl. in GC? --     Constitutional: Alert and oriented. Well appearing and in no acute distress. Cardiovascular: Normal rate, regular rhythm. Grossly normal heart sounds.  Good peripheral circulation. Respiratory: Normal respiratory effort.  No retractions. Lungs CTAB. Musculoskeletal: Biceps tenderness for range of motion. Increased tenderness to palpation. Distally neurovascularly intact. Good capillary refill. Strength equal bilaterally. Flexion. Neurologic:  Normal speech and language. No gross focal neurologic deficits are appreciated. No gait instability. Skin:  Skin is warm, dry and intact. No rash noted. Psychiatric: Mood and affect are normal. Speech and behavior are  normal.  ____________________________________________   LABS (all labs ordered are listed, but only abnormal results are displayed)  Labs Reviewed - No data to display ____________________________________________  EKG   ____________________________________________  RADIOLOGY  Deferred at this visit. ____________________________________________   PROCEDURES  Procedure(s) performed: None  Critical Care performed: No  ____________________________________________   INITIAL IMPRESSION / ASSESSMENT AND PLAN / ED COURSE  Pertinent labs & imaging results that were available during my care of the patient were reviewed by me and considered in my medical decision making (see chart for details). Review of the [place] CSRS was performed in accordance of the [facility] prior to dispensing any controlled drugs.  Acute right upper bicep strain. Reassurance provided to the patient. Rx given for Toradol 60 mg IM while in the ED as well as Valium 5 mg by mouth. Patient discharged home with prescription for Zanaflex and Mobic.  Clinical Course    ____________________________________________   FINAL CLINICAL IMPRESSION(S) / ED DIAGNOSES  Final diagnoses:  Biceps strain, right, initial encounter     This chart was dictated using voice recognition software/Dragon. Despite best efforts to proofread, errors can occur which can change the meaning. Any change was purely unintentional.    Evangeline Dakinharles M Elisia Stepp, PA-C 02/28/16 2214    Sharman CheekPhillip Stafford, MD 02/28/16 2342

## 2016-02-28 NOTE — ED Triage Notes (Signed)
Pt c/o R bicep and shoulder pain starting Monday. 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 Pt c/o decrease mobility and pain in R upper arm. Pt used ibuprofen OTC and topical applications w/o relief.

## 2016-03-03 ENCOUNTER — Encounter: Payer: Self-pay | Admitting: Emergency Medicine

## 2016-03-03 ENCOUNTER — Emergency Department
Admission: EM | Admit: 2016-03-03 | Discharge: 2016-03-03 | Disposition: A | Discharge status: Home / Self Care | Payer: Medicare Other | Attending: Emergency Medicine | Admitting: Emergency Medicine

## 2016-03-03 DIAGNOSIS — Z87891 Personal history of nicotine dependence: Secondary | ICD-10-CM | POA: Diagnosis not present

## 2016-03-03 DIAGNOSIS — I10 Essential (primary) hypertension: Secondary | ICD-10-CM | POA: Insufficient documentation

## 2016-03-03 DIAGNOSIS — Z79899 Other long term (current) drug therapy: Secondary | ICD-10-CM | POA: Insufficient documentation

## 2016-03-03 DIAGNOSIS — N390 Urinary tract infection, site not specified: Secondary | ICD-10-CM | POA: Insufficient documentation

## 2016-03-03 DIAGNOSIS — R300 Dysuria: Secondary | ICD-10-CM | POA: Diagnosis present

## 2016-03-03 LAB — URINALYSIS COMPLETE WITH MICROSCOPIC (ARMC ONLY)
BILIRUBIN URINE: NEGATIVE
Bacteria, UA: NONE SEEN
Glucose, UA: NEGATIVE mg/dL
KETONES UR: NEGATIVE mg/dL
NITRITE: NEGATIVE
PH: 7 (ref 5.0–8.0)
PROTEIN: 100 mg/dL — AB
Specific Gravity, Urine: 1.015 (ref 1.005–1.030)

## 2016-03-03 MED ORDER — PHENAZOPYRIDINE HCL 200 MG PO TABS
200.0000 mg | ORAL_TABLET | Freq: Once | ORAL | Status: AC
  Administered 2016-03-03: 200 mg via ORAL
  Filled 2016-03-03: qty 1

## 2016-03-03 MED ORDER — PHENAZOPYRIDINE HCL 200 MG PO TABS: 200.0000 mg | ORAL_TABLET | Freq: Three times a day (TID) | ORAL | 0 refills | Status: DC | PRN

## 2016-03-03 MED ORDER — SULFAMETHOXAZOLE-TRIMETHOPRIM 800-160 MG PO TABS
1.0000 | ORAL_TABLET | Freq: Once | ORAL | Status: AC
  Administered 2016-03-03: 1 via ORAL
  Filled 2016-03-03: qty 1

## 2016-03-03 MED ORDER — SULFAMETHOXAZOLE-TRIMETHOPRIM 800-160 MG PO TABS: 1.0000 | ORAL_TABLET | Freq: Two times a day (BID) | ORAL | 0 refills | Status: DC

## 2016-03-03 NOTE — ED Notes (Signed)
FIRST NURSE NOTE: Patient presents with c/o dysuria and passing small blood clots. Here on Tuesday for the same. Patient reports worsening pain. States, "I have antibiotics at the pharmacy, but when I went to get them they were already closed".

## 2016-03-03 NOTE — ED Notes (Signed)
  Reviewed d/c instructions, follow-up care, and prescriptions with pt. Pt verbalized understanding 

## 2016-03-03 NOTE — ED Provider Notes (Signed)
[facility] Emergency Department Provider Note   ____________________________________________   None    (approximate)  I have reviewed the triage vital signs and the nursing notes.   HISTORY  Chief Complaint Dysuria and Urinary Frequency    HPI Dawn Baldwin is a 50 y.o. female patient complaining of 2 days of pain with urination, urinary frequency, urinary urgency. Patient denies any vaginal discharge or flank pain. Patient denies any fever associated this complaint.Patient is rating her pain as a 10 over 10. No palliative measures taken for this complaint.   Past Medical History:  Diagnosis Date  . Allergy   . Anemia   . Bipolar affective disorder (HCC)   . Depression   . GERD (gastroesophageal reflux disease)   . Hypertension     Patient Active Problem List   Diagnosis Date Noted  . Hot flushes, perimenopausal 06/09/2015  . Hypertension 06/03/2015  . Bipolar affective disorder (HCC) 03/10/2015  . Allergic rhinitis 03/10/2015  . Smoker 03/10/2015  . Obesity (BMI 30-39.9) 03/10/2015    History reviewed. No pertinent surgical history.  Prior to Admission medications   Medication Sig Start Date End Date Taking? Authorizing Provider  amLODipine (NORVASC) 10 MG tablet Take 1 tablet (10 mg total) by mouth daily. 06/09/15   Amy Rusty Aus, NP  Blood Pressure Monitoring (BLOOD PRESSURE CUFF) MISC 1 each by Does not apply route daily. 06/03/15   Amy Rusty Aus, NP  diclofenac (VOLTAREN) 75 MG EC tablet Take 1 tablet (75 mg total) by mouth 2 (two) times daily. 02/28/16   Charmayne Sheer Beers, PA-C  gabapentin (NEURONTIN) 100 MG capsule Take 2 capsules (200 mg total) by mouth at bedtime. 01/25/16   Amy Rusty Aus, NP  phenazopyridine (PYRIDIUM) 200 MG tablet Take 1 tablet (200 mg total) by mouth 3 (three) times daily as needed for pain. 03/03/16   Joni Reining, PA-C  QUEtiapine (SEROQUEL) 100 MG tablet Take 100 mg by mouth at bedtime. 08/16/15    Historical Provider, MD  sulfamethoxazole-trimethoprim (BACTRIM DS,SEPTRA DS) 800-160 MG tablet Take 1 tablet by mouth 2 (two) times daily. 03/03/16   Joni Reining, PA-C  tiZANidine (ZANAFLEX) 4 MG capsule Take 1 capsule (4 mg total) by mouth 4 (four) times daily as needed for muscle spasms. 02/28/16   Evangeline Dakin, PA-C    Allergies Review of patient's allergies indicates no known allergies.  Family History  Problem Relation Age of Onset  . Hypertension Mother     Social History Social History  Substance Use Topics  . Smoking status: Former Smoker    Packs/day: 1.00    Types: Cigars    Quit date: 03/26/2015  . Smokeless tobacco: Never Used  . Alcohol use No    Review of Systems Constitutional: No fever/chills Eyes: No visual changes. ENT: No sore throat. Cardiovascular: Denies chest pain. Respiratory: Denies shortness of breath. Gastrointestinal: No abdominal pain.  No nausea, no vomiting.  No diarrhea.  No constipation. Genitourinary: Positive dysuria, urgency, and frequency. Musculoskeletal: Negative for back pain. Skin: Negative for rash. Neurological: Negative for headaches, focal weakness or numbness. Psychiatric:Bipolar  Endocrine:Hypertension ____________________________________________   PHYSICAL EXAM:  VITAL SIGNS: ED Triage Vitals [03/03/16 2030]  Enc Vitals Group     BP (!) 136/94     Pulse Rate 84     Resp 18     Temp 98.2 F (36.8 C)     Temp Source Oral     SpO2 98 %  Weight 232 lb (105.2 kg)     Height 5\' 7"  (1.702 m)     Head Circumference      Peak Flow      Pain Score 10     Pain Loc      Pain Edu?      Excl. in GC?     Constitutional: Alert and oriented. Well appearing and in no acute distress. Eyes: Conjunctivae are normal. PERRL. EOMI. Head: Atraumatic. Nose: No congestion/rhinnorhea. Mouth/Throat: Mucous membranes are moist.  Oropharynx non-erythematous. Neck: No stridor.  No cervical spine tenderness to  palpation. Hematological/Lymphatic/Immunilogical: No cervical lymphadenopathy. Cardiovascular: Normal rate, regular rhythm. Grossly normal heart sounds.  Good peripheral circulation. Respiratory: Normal respiratory effort.  No retractions. Lungs CTAB. Gastrointestinal: Soft and nontender. No distention. No abdominal bruits. No CVA tenderness. Musculoskeletal: No lower extremity tenderness nor edema.  No joint effusions. Neurologic:  Normal speech and language. No gross focal neurologic deficits are appreciated. No gait instability. Skin:  Skin is warm, dry and intact. No rash noted. Psychiatric: Mood and affect are normal. Speech and behavior are normal.  ____________________________________________   LABS (all labs ordered are listed, but only abnormal results are displayed)  Labs Reviewed  URINALYSIS COMPLETEWITH MICROSCOPIC ([facility] ONLY) - Abnormal; Notable for the following:       Result Value   Color, Urine YELLOW (*)    APPearance HAZY (*)    Hgb urine dipstick 3+ (*)    Protein, ur 100 (*)    Leukocytes, UA TRACE (*)    Squamous Epithelial / LPF 0-5 (*)    All other components within normal limits   ____________________________________________  EKG   ____________________________________________  RADIOLOGY   ____________________________________________   PROCEDURES  Procedure(s) performed: None  Procedures  Critical Care performed: No  ____________________________________________   INITIAL IMPRESSION / ASSESSMENT AND PLAN / ED COURSE  Pertinent labs & imaging results that were available during my care of the patient were reviewed by me and considered in my medical decision making (see chart for details).  Urinary tract infection. Discussed urinalysis results with patient. Patient given Bactrim and Pyridium tonight and advised to give prescription for same medication in the morning.  Clinical Course      ____________________________________________   FINAL CLINICAL IMPRESSION(S) / ED DIAGNOSES  Final diagnoses:  UTI (lower urinary tract infection)      NEW MEDICATIONS STARTED DURING THIS VISIT:  New Prescriptions   PHENAZOPYRIDINE (PYRIDIUM) 200 MG TABLET    Take 1 tablet (200 mg total) by mouth 3 (three) times daily as needed for pain.   SULFAMETHOXAZOLE-TRIMETHOPRIM (BACTRIM DS,SEPTRA DS) 800-160 MG TABLET    Take 1 tablet by mouth 2 (two) times daily.     Note:  This document was prepared using Dragon voice recognition software and may include unintentional dictation errors.    Joni Reiningonald K Miklo Aken, PA-C 03/03/16 2200    Rockne MenghiniAnne-Caroline Norman, MD 03/04/16 [phone]

## 2016-03-03 NOTE — ED Triage Notes (Signed)
Patient reports that she has been having pain with urination and urinary frequency times two days.

## 2016-03-06 LAB — URINE CULTURE: SPECIAL REQUESTS: NORMAL

## 2016-03-07 DIAGNOSIS — M7542 Impingement syndrome of left shoulder: Secondary | ICD-10-CM | POA: Diagnosis not present

## 2016-03-07 DIAGNOSIS — M7541 Impingement syndrome of right shoulder: Secondary | ICD-10-CM | POA: Diagnosis not present

## 2016-03-20 ENCOUNTER — Encounter: Payer: Self-pay | Admitting: Family Medicine

## 2016-03-20 ENCOUNTER — Ambulatory Visit (INDEPENDENT_AMBULATORY_CARE_PROVIDER_SITE_OTHER): Payer: Medicare Other | Admitting: Family Medicine

## 2016-03-20 VITALS — BP 136/89 | HR 74 | Temp 98.0°F | Resp 16 | Ht 67.0 in | Wt 234.0 lb

## 2016-03-20 DIAGNOSIS — J069 Acute upper respiratory infection, unspecified: Secondary | ICD-10-CM | POA: Diagnosis not present

## 2016-03-20 DIAGNOSIS — B9789 Other viral agents as the cause of diseases classified elsewhere: Secondary | ICD-10-CM

## 2016-03-20 DIAGNOSIS — J029 Acute pharyngitis, unspecified: Secondary | ICD-10-CM

## 2016-03-20 DIAGNOSIS — J358 Other chronic diseases of tonsils and adenoids: Secondary | ICD-10-CM | POA: Diagnosis not present

## 2016-03-20 LAB — POCT RAPID STREP A (OFFICE): Rapid Strep A Screen: NEGATIVE

## 2016-03-20 NOTE — Patient Instructions (Signed)
Thank you for coming in to clinic today.  1. It sounds like you had a Viral Pharyngitis (Sore Throat) caused by a Virus - this will most likely run it's course in 5 to 10 days. Wash hands good to prevent spread of virus. - I do not see any evidence of Strep Throat today. No ear infection. - Drink extra clear fluids (water, or G2 gatorade), try colder soft foods if needed otherwise regular diet - Drink warm herbal tea with honey for sore throat  - Recommend warm salt water gargles to help remove the Tonsolith or small debris collection in your tonsil  Rapid strep test NEGATIVE  We will send the Strep test for a Culture - if it is positive, we will contact you over next 24-48 hours, and start antibiotics, otherwise, this is most likely a virus and NO antibiotics needed.  if fevers >101.18F persistent after next 2-3 days despite Motrin / Tylenol, may return as well. Other reasons to return to clinic include nausea, vomiting, unable to tolerate food or drink  Please schedule a follow-up appointment with Amy or Dr Althea CharonKaramalegos within next 2 to 3 months as needed, for blood pressure.  If you have any other questions or concerns, please feel free to call the clinic or send a message through MyChart. You may also schedule an earlier appointment if necessary.  Saralyn PilarAlexander Gentri Guardado, DO [facility]

## 2016-03-20 NOTE — Progress Notes (Signed)
Subjective:    Patient ID: Dawn Baldwin, female    DOB: 01/01/1966, 50 y.o.   MRN: [id number]  Dawn Baldwin is a 50 y.o. female presenting on 03/20/2016 for Sore Throat (Onset for three days, white spot in back of throat )   HPI   SORE THROAT / URI SYMPTOMS: - Reports symptoms started within 1 week, started with sore throat, nasal congestion, cough. Gradual improvement in sore throat and cold symptoms with DayQuil, NyQuil. Describes productive sputum but no regular coughing. No significant persistent sore throat. Concerned with white spot on left tonsil. - Admits sick contact with husband having viral URI. No contact with young children. Plans to visit granddaughter in 2 days, 3 yr old. - Last strep several years ago - Denies any fevers/chills, sweats, nausea, vomiting, abdominal pain, swollen lymph nodes   Social History  Substance Use Topics  . Smoking status: Former Smoker    Packs/day: 1.00    Types: Cigars    Quit date: 03/26/2015  . Smokeless tobacco: Never Used  . Alcohol use No    Review of Systems Per HPI unless specifically indicated above     Objective:    BP 136/89 (BP Location: Right Arm, Patient Position: Sitting, Cuff Size: Large)   Pulse 74   Temp 98 F (36.7 C) (Oral)   Resp 16   Ht 5\' 7"  (1.702 m)   Wt 234 lb (106.1 kg)   LMP 03/18/2016   BMI 36.65 kg/m   Wt Readings from Last 3 Encounters:  03/20/16 234 lb (106.1 kg)  03/03/16 232 lb (105.2 kg)  02/28/16 234 lb (106.1 kg)    Physical Exam  Constitutional: She appears well-developed and well-nourished. No distress.  Well-appearing, comfortable, cooperative  HENT:  Head: Normocephalic and atraumatic.  Mouth/Throat: Oropharynx is clear and moist.  Left tonsil with small cream/white colored tonsolith without significant evidence of exudate. No edema or asymmetry. No erythema.  Eyes: Pupils are equal, round, and reactive to light. Right eye exhibits no discharge. Left eye exhibits no discharge.    Neck: Normal range of motion. Neck supple.  Cardiovascular: Normal rate, regular rhythm, normal heart sounds and intact distal pulses.   No murmur heard. Pulmonary/Chest: Effort normal and breath sounds normal. No respiratory distress. She has no rales.  Lymphadenopathy:    She has no cervical adenopathy.  Neurological: She is alert.  Skin: Skin is warm and dry. She is not diaphoretic.  Nursing note and vitals reviewed.  Results for orders placed or performed in visit on 03/20/16  POCT rapid strep A  Result Value Ref Range   Rapid Strep A Screen Negative Negative      Assessment & Plan:   Problem List Items Addressed This Visit    None    Visit Diagnoses    Sore throat    -  Primary  Consistent with acute pharyngitis and URI with cough, suspected viral etiology with constellation of viral symptoms and +sick contacts. No significant history of strep throat, no recent antibiotics. Afebrile and no focal signs of infection (TM's clear, lungs clear). Unlikely acute strep throat with Centor Score 0. Rapid strep obtained by staff, negative. - Left tonsillar tonsolith likely due to recent illness  Plan: 1. Reassurance, likely viral etiology of pharyngitis, should be self limited within 5-7 days 2. Discussion with patient, given small tonsolith vs exudate on left tonsil she requests Strep Culture to be sent out, given she plans to spend time this weekend with her granddaughter.  Advised that unlikely positive, but can do this to confirm. 3. Improved hydration, continue OTC cold remedy as needed, may try mucinex for mucus production 4. Start warm salt water gargles to help dislodge and resolve tonsolith 5. Return criteria given, follow-up if worsening     Relevant Orders   POCT rapid strep A (Completed)   Culture, Group A Strep   Viral URI with cough       Tonsillith            Follow up plan: Return in about 3 months (around 06/19/2016) for blood pressure.  Saralyn Pilar, DO [facility] 03/20/2016, 4:31 PM

## 2016-03-22 LAB — CULTURE, GROUP A STREP: ORGANISM ID, BACTERIA: NORMAL

## 2016-04-17 ENCOUNTER — Other Ambulatory Visit: Payer: Self-pay | Admitting: Family Medicine

## 2016-04-17 ENCOUNTER — Ambulatory Visit
Admission: RE | Admit: 2016-04-17 | Discharge: 2016-04-17 | Disposition: A | Discharge status: Home / Self Care | Payer: Medicare Other | Source: Ambulatory Visit | Attending: Family Medicine | Admitting: Family Medicine

## 2016-04-17 DIAGNOSIS — Z1231 Encounter for screening mammogram for malignant neoplasm of breast: Secondary | ICD-10-CM | POA: Insufficient documentation

## 2016-05-15 ENCOUNTER — Ambulatory Visit: Payer: Medicare Other | Admitting: Family Medicine

## 2016-05-22 ENCOUNTER — Encounter: Payer: Self-pay | Admitting: Family Medicine

## 2016-05-22 ENCOUNTER — Ambulatory Visit (INDEPENDENT_AMBULATORY_CARE_PROVIDER_SITE_OTHER): Payer: Medicare Other | Admitting: Family Medicine

## 2016-05-22 DIAGNOSIS — M65311 Trigger thumb, right thumb: Secondary | ICD-10-CM | POA: Diagnosis not present

## 2016-05-22 MED ORDER — DICLOFENAC SODIUM 1 % TD GEL: 2.0000 g | Freq: Three times a day (TID) | TRANSDERMAL | 2 refills | Status: DC | PRN

## 2016-05-22 NOTE — Progress Notes (Signed)
Subjective:    Patient ID: Dawn Baldwin, female    DOB: 05/27/1966, 50 y.o.   MRN: [id number]  Dawn Baldwin is a 50 y.o. female presenting on 05/22/2016 for thumb swell (every morning ache onscale 5 for pain)   HPI   Trigger Thumb, Right, chronic - Reports chronic history of Right trigger thumb with localized pain and with mechanical locking on flexion, she has been evaluated for this by other providers in the past, including [facility] Dr Martha ClanKrasinski and Altamese CabalJones Maurice PA, on chart review she has received a R thumb corticosteroid injection at their office approx 12/2015 with only temporary improvement for few weeks to month, but did not resolve the problem. - Today still complaints of persistent R thumb triggering and pain. 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 Pain is moderate severity 5/10. - Takes Diclofenac with relief of other joint pain but not helping her thumb, not tried topical treatments, did wear a thumb splint for period of time but states she needs a new one - Admits intermittent swelling in thumb - Denies any redness, fevers/chills, other joint or finger pain or swelling  Social History  Substance Use Topics  . Smoking status: Former Smoker    Packs/day: 1.00    Types: Cigars    Quit date: 03/26/2015  . Smokeless tobacco: Never Used  . Alcohol use No    Review of Systems Per HPI unless specifically indicated above     Objective:    BP 134/86 (BP Location: Left Arm, Patient Position: Sitting, Cuff Size: Large)   Pulse 68   Temp 97.9 F (36.6 C) (Oral)   Resp 16   Ht 5\' 7"  (1.702 m)   Wt 234 lb (106.1 kg)   BMI 36.65 kg/m   Wt Readings from Last 3 Encounters:  05/22/16 234 lb (106.1 kg)  03/20/16 234 lb (106.1 kg)  03/03/16 232 lb (105.2 kg)    Physical Exam  Constitutional: She appears well-developed and well-nourished. No distress.    Well-appearing, comfortable, cooperative  Musculoskeletal: She exhibits no edema.  Right Hand Inspection: normal appearance, symmetrical to Left Palpation: palmar aspect of R thumb at base MCP joint with palpable firm knot with snapping movement with flexion and extension ROM: reduced full thumb flexion, when triggered unable to fully extend without additional force, other fingers normal ROM flex/ext Strength: 5/5 thumb opposition due to pain, grip 5/5 bilateral Neurovascular: distal sensation intact to light touch  Neurological: She is alert.  Skin: Skin is warm and dry. No rash noted. She is not diaphoretic. No erythema.  Psychiatric: She has a normal mood and affect. Her behavior is normal. Thought content normal.  Stable rapid pressured speech at times  Nursing note and vitals reviewed.      Assessment & Plan:   Problem List Items Addressed This Visit    Trigger thumb of right hand    Worsening chronic Right Thumb trigger finger, remains unresolved after last corticosteroid injection 12/2015 by orthopedics. Temporary relief with thumb splint and NSAIDs  Plan: 1. Start topical anti-inflammatory with Voltaren gel 1% use 2g TID for 1-2 weeks then repeat PRN, will likely need to complete prior authorization 2. Continue other analgesic therapy 3. Recommend OTC R thumb spica splint for immobilization during lifting and activity at work, can wear overnight as well. Activity modification, patient declined time off work 4. New referral  placed for [facility] to follow-up on this issue, notified to be scheduled within 1 week for re-evaluation, may need repeat injection vs surgical release, given seems to have failed conservative therapy 5. Follow-up after sees ortho as needed      Relevant Medications   diclofenac sodium (VOLTAREN) 1 % GEL   Other Relevant Orders   AMB referral to orthopedics      Meds ordered this encounter  Medications  . diclofenac sodium (VOLTAREN) 1 % GEL     Sig: Apply 2 g topically 3 (three) times daily as needed. For Right thumb only. Up to 1-2 weeks then stop.    Dispense:  100 g    Refill:  2      Follow up plan: Return in about 4 weeks (around 06/19/2016), or if symptoms worsen or fail to improve, for right trigger thumb.  Saralyn PilarAlexander Arisa Congleton, DO [facility] 05/22/2016, 5:18 PM

## 2016-05-22 NOTE — Patient Instructions (Signed)
Thank you for coming in to clinic today.  1. You have a Right Trigger thumb - It sounds like only temporary relief with steroid injection from Orthopedics - This may need surgery to fix - We will contact [facility] to have them schedule an appointment with you soon for follow-up on this problem - In meantime, start topical Voltaren gel apply small amount directly to base of your thumb where the pain is 3 times a day for 1-2 weeks at a time, then STOP and wait 1-2 weeks and then you may try this medicine again as needed - we may need to get it covered by insurance first before using this medicine, due to cost and coverage, if unable to get it by prescription, then perhaps the orthopedics office can help you further  Recommend purchase an over the counter (at drug store) Right Thumb Spica Splint to wear when active at work or sleeping at night to help ease pain  May use topical ice as well   ORTHOPEDICS  [facility] [address], [place], [place] [address] Phone: [phone] Fax: [phone]  Dr Juanell FairlyKevin Krasinski   Please schedule a follow-up appointment with Dr. Althea CharonKaramalegos in as needed within 4 weeks for R trigger thumb, otherwise need to see Orthopedics first  If you have any other questions or concerns, please feel free to call the clinic or send a message through MyChart. You may also schedule an earlier appointment if necessary.  Saralyn PilarAlexander Ekaterini Capitano, DO [facility]

## 2016-05-22 NOTE — Assessment & Plan Note (Signed)
Worsening chronic Right Thumb trigger finger, remains unresolved after last corticosteroid injection 12/2015 by orthopedics. Temporary relief with thumb splint and NSAIDs  Plan: 1. Start topical anti-inflammatory with Voltaren gel 1% use 2g TID for 1-2 weeks then repeat PRN, will likely need to complete prior authorization 2. Continue other analgesic therapy 3. Recommend OTC R thumb spica splint for immobilization during lifting and activity at work, can wear overnight as well. Activity modification, patient declined time off work 4. New referral placed for [facility] to follow-up on this issue, notified to be scheduled within 1 week for re-evaluation, may need repeat injection vs surgical release, given seems to have failed conservative therapy 5. Follow-up after sees ortho as needed

## 2016-05-24 ENCOUNTER — Telehealth: Payer: Self-pay | Admitting: Family Medicine

## 2016-05-24 NOTE — Telephone Encounter (Signed)
The gel for thumb was going to cost $55.  She asked if there was anything else she could try.  Her call back number is [phone]

## 2016-05-24 NOTE — Telephone Encounter (Signed)
Unfortunately not, there aren't any cheaper alternatives for topical anti-inflammatory medicines, this was the only generic option.  She can continue to take the oral anti-inflammatory with Diclofenac 75mg  twice a day as prescribed.  Otherwise, she will wait until her upcoming appointment with [facility] to discuss further, as they may talk about repeat injection, surgery, and possibly they carry samples of the topical anti-inflammatory.  Saralyn PilarAlexander Jaycelynn Knickerbocker, DO [facility] 05/24/2016, 1:17 PM

## 2016-05-24 NOTE — Telephone Encounter (Signed)
Pt. Advised. 

## 2016-05-29 DIAGNOSIS — M65311 Trigger thumb, right thumb: Secondary | ICD-10-CM | POA: Diagnosis not present

## 2016-05-29 DIAGNOSIS — F310 Bipolar disorder, current episode hypomanic: Secondary | ICD-10-CM | POA: Diagnosis not present

## 2016-06-09 IMAGING — US US EXTREM LOW VENOUS*L*
1 series · 13 of 24 positions shown · non-contrast
Comparison: None.

CLINICAL DATA: Pain and swelling left lower extremity 1 month.



[Series 1: us extrem low venous*left* · 0.08mm/px · 13 of 37 slices shown]
[im 1/37]
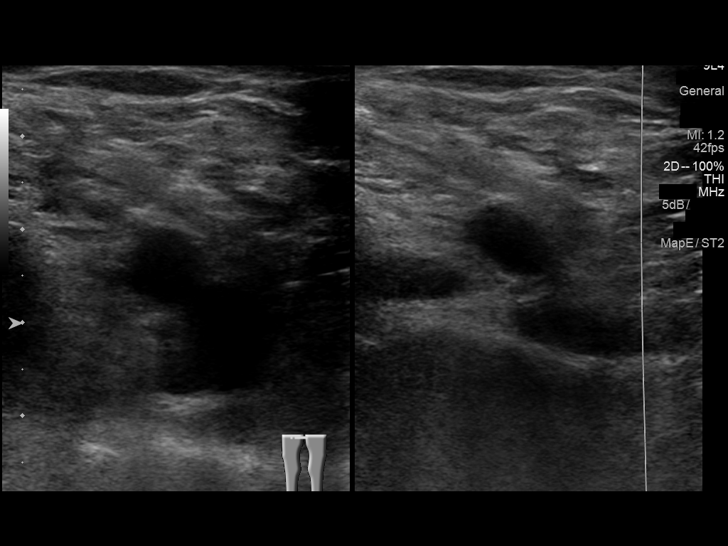
[im 4/37]
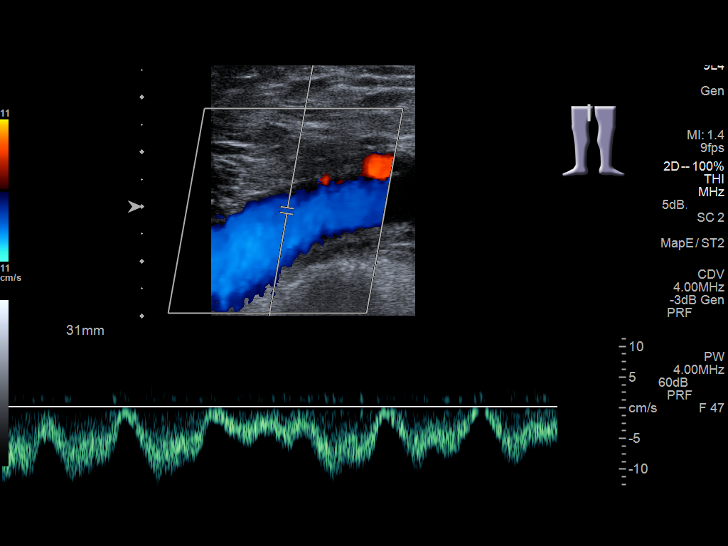
[im 7/37]
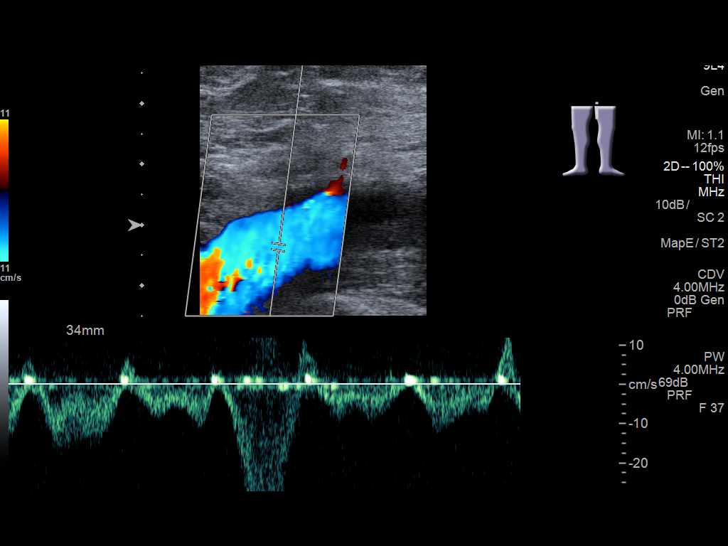
[im 10/37]
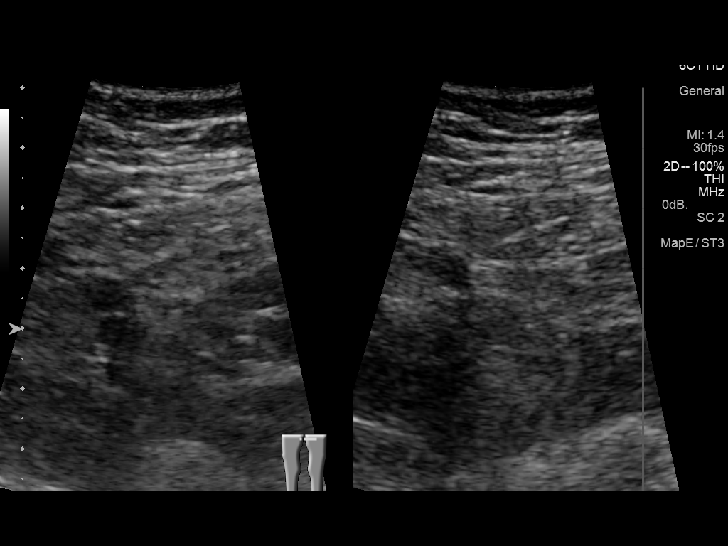
[im 13/37]
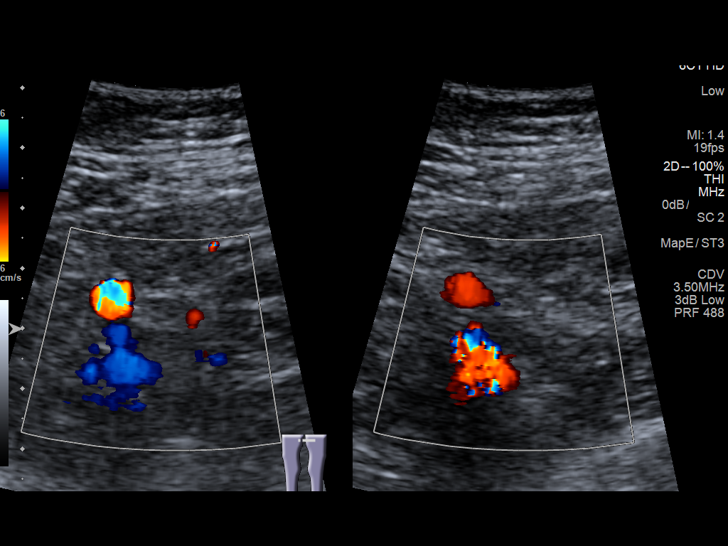
[im 16/37]
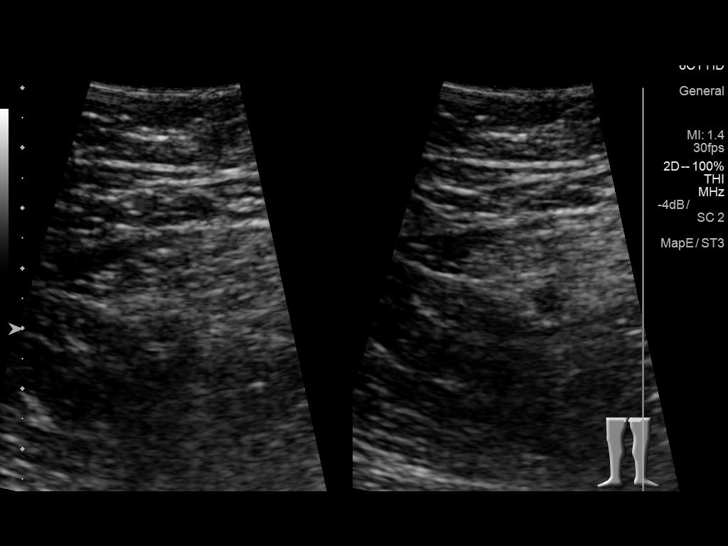
[im 19/37]
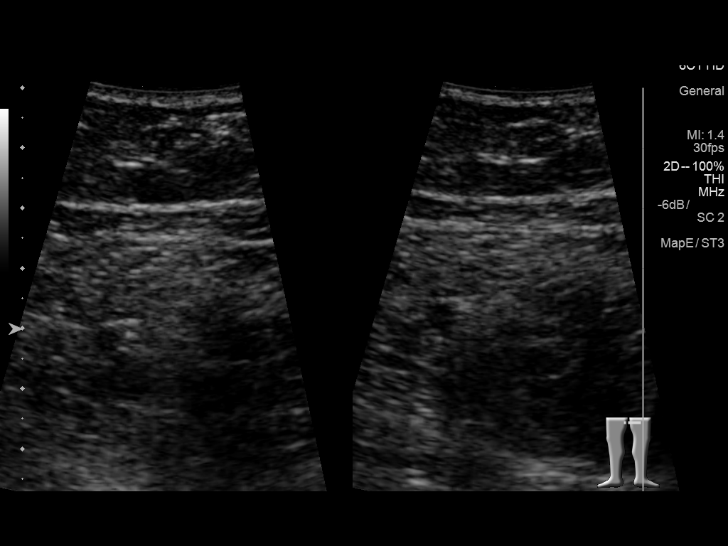
[im 21/37]
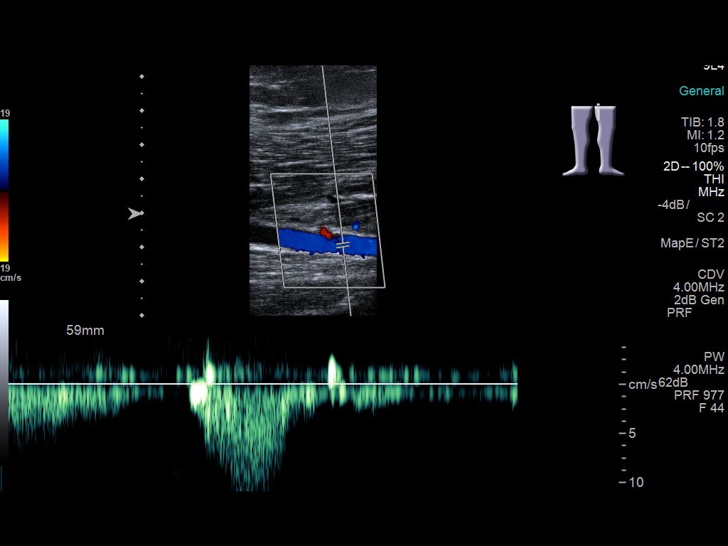
[im 24/37]
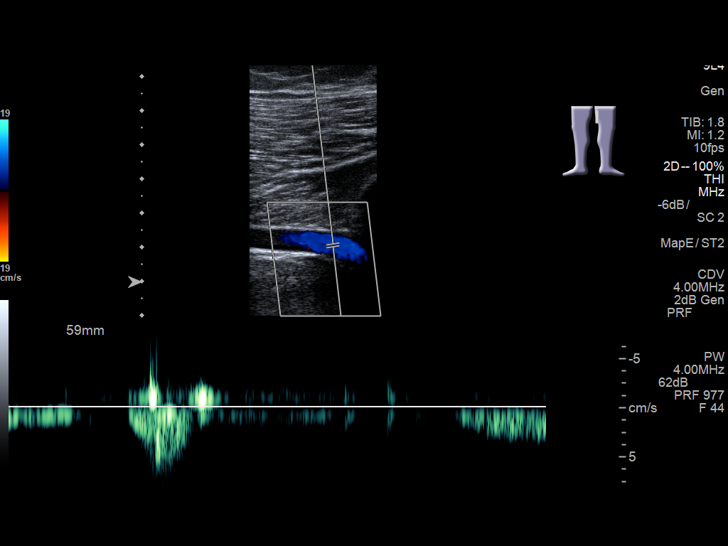
[im 27/37]
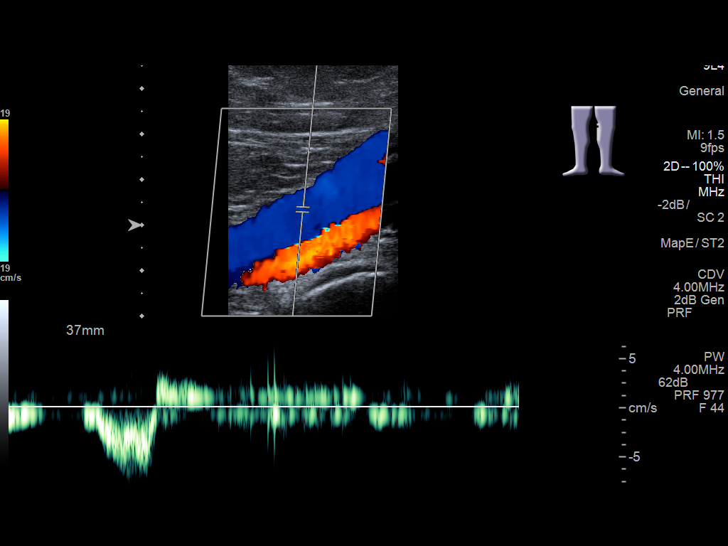
[im 30/37]
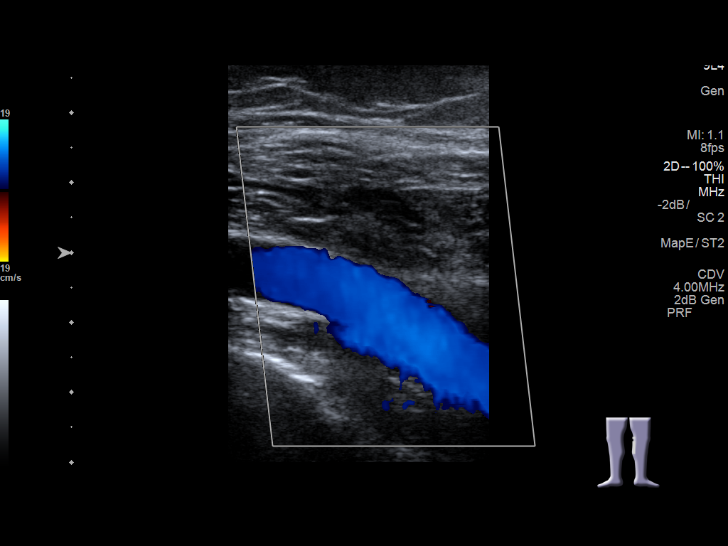
[im 33/37]
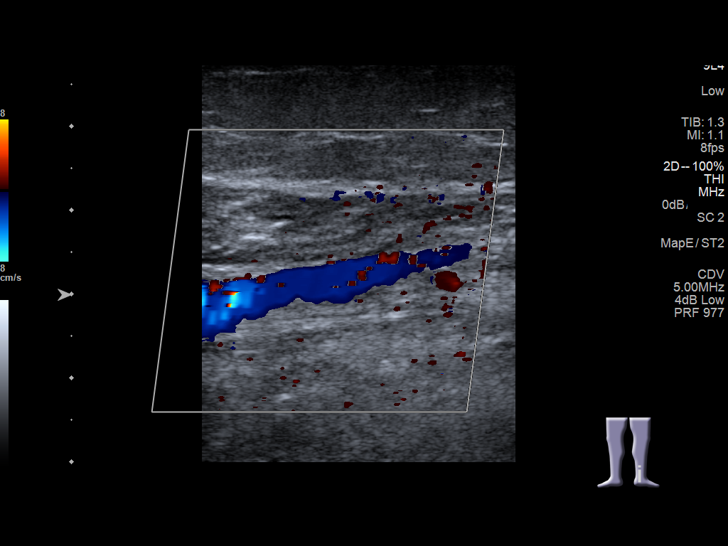
[im 37/37]
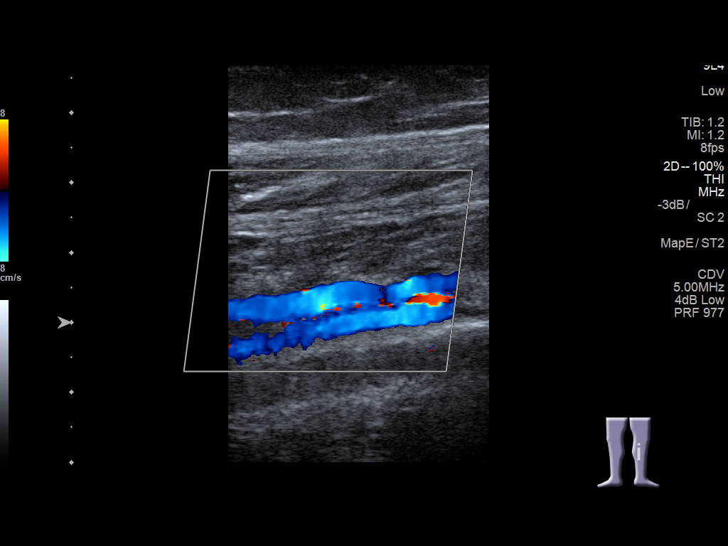

[13 of 24 positions shown; findings below may reference images not displayed]

FINDINGS: Contralateral Common Femoral Vein: Respiratory phasicity is normal
and symmetric with the symptomatic side. No evidence of thrombus.
Normal compressibility.

Common Femoral Vein: No evidence of thrombus. Normal
compressibility, respiratory phasicity and response to augmentation.

Saphenofemoral Junction: No evidence of thrombus. Normal
compressibility and flow on color Doppler imaging.

Profunda Femoral Vein: No evidence of thrombus. Normal
compressibility and flow on color Doppler imaging.

Femoral Vein: No evidence of thrombus. Normal compressibility,
respiratory phasicity and response to augmentation.

Popliteal Vein: No evidence of thrombus. Normal compressibility,
respiratory phasicity and response to augmentation.

Calf Veins: No evidence of thrombus. Normal compressibility and flow
on color Doppler imaging.

Superficial Great Saphenous Vein: No evidence of thrombus. Normal
compressibility and flow on color Doppler imaging.

Venous Reflux:  None.

Other Findings:  None.
IMPRESSION: No evidence of deep venous thrombosis.

## 2016-07-09 ENCOUNTER — Ambulatory Visit (INDEPENDENT_AMBULATORY_CARE_PROVIDER_SITE_OTHER): Payer: Medicare HMO | Admitting: Family Medicine

## 2016-07-09 ENCOUNTER — Encounter: Payer: Self-pay | Admitting: Family Medicine

## 2016-07-09 ENCOUNTER — Other Ambulatory Visit: Payer: Medicare Other

## 2016-07-09 VITALS — BP 133/82 | HR 82 | Temp 98.5°F | Resp 16 | Ht 67.0 in | Wt 231.0 lb

## 2016-07-09 DIAGNOSIS — R7309 Other abnormal glucose: Secondary | ICD-10-CM | POA: Diagnosis not present

## 2016-07-09 DIAGNOSIS — R351 Nocturia: Secondary | ICD-10-CM | POA: Diagnosis not present

## 2016-07-09 DIAGNOSIS — N3281 Overactive bladder: Secondary | ICD-10-CM

## 2016-07-09 DIAGNOSIS — R358 Other polyuria: Secondary | ICD-10-CM | POA: Diagnosis not present

## 2016-07-09 DIAGNOSIS — R3589 Other polyuria: Secondary | ICD-10-CM

## 2016-07-09 DIAGNOSIS — R3915 Urgency of urination: Secondary | ICD-10-CM | POA: Diagnosis not present

## 2016-07-09 LAB — POCT URINALYSIS DIPSTICK
BILIRUBIN UA: NEGATIVE
Blood, UA: NEGATIVE
GLUCOSE UA: NEGATIVE
KETONES UA: NEGATIVE
LEUKOCYTES UA: NEGATIVE
Nitrite, UA: NEGATIVE
PH UA: 5
Protein, UA: NEGATIVE
Spec Grav, UA: 1.015
Urobilinogen, UA: NEGATIVE

## 2016-07-09 NOTE — Assessment & Plan Note (Addendum)
Considered possible hyperglycemia as etiology of her urinary symptoms, she is obese and has family history of DM with mother, recent glucose have been normal, she has had prior elevated A1c 5.5 with avg glucose 111 (outside records from [facility], 05/2014) - Also at increased risk on Seroquel atypical anti-psychotic with metabolic side effects inc risk DM  Plan: 1. Check A1c today, follow-up result 2. Follow-up as needed

## 2016-07-09 NOTE — Patient Instructions (Signed)
Thank you for coming in to clinic today.  1.  Your urine test is completely normal. No sign of infection, blood, sugar, or protein in your urine.  We can send off the urine for a culture to be sure that we aren't missing any possible infection.  In the meantime, next plan is to proceed with Urology referral to do further Urodynamic testing to see if you have Overactive Bladder or Urgency, may have mixed combination of several bladder problems.  If you don't hear back with an appointment in 1-2 weeks then go ahead and call to check status.  [facility] [address] [place] [id number]  Main: [phone]   Also as discussed we can consider future A1c Diabetes screening test, however this was not covered by your insurance for this problem.  If symptoms get worse, with burning, pain, blood in urine, fever/chills, then notify office for re-check urine and antibiotics.  Please schedule a follow-up appointment with Dr. Althea CharonKaramalegos as needed in 4-6 weeks for Urinary Frequency  If you have any other questions or concerns, please feel free to call the clinic or send a message through MyChart. You may also schedule an earlier appointment if necessary.  Saralyn PilarAlexander Karamalegos, DO [facility]

## 2016-07-09 NOTE — Assessment & Plan Note (Signed)
Recent worsening history of chronic polyuria, frequency, nocturia, and some urgency. Concern for possible OAB or other mixed etiology. Currently symptoms not attributable to UTI with negative UA, and without consistent clinical UTI symptoms, awaiting final culture results. Also considered possible hyperglycemia as etiology, she is obese and has family history of DM with mother, recent glucose have been normal, she has had prior elevated A1c 5.5 with avg glucose 111 (outside records from [facility], 05/2014)  Plan: 1. Check A1c today, follow-up result 2. Check Urine culture 3. Referral to [facility] as requested from discussion with patient for further evaluation, likely urodynamic testing given constellation of symptoms seem to be more chronic in nature rather than just her recent complaint of several weeks. 4. Follow-up as needed

## 2016-07-09 NOTE — Progress Notes (Signed)
Subjective:    Patient ID: Dawn Baldwin, female    DOB: 1966-02-22, 50 y.o.   MRN: [id number]  Dawn Baldwin is a 51 y.o. female presenting on 07/09/2016 for Urinary Tract Infection (frquent urination onset couple of weeks)  Patient presents for a same day appointment.  HPI   URINARY FREQUENCY, URGENCY, NOCTURIA / POLYURIA: - Reports symptoms worsening over past 2-3 weeks, however has had recurrent issue with same symptoms for longer course perhaps >2-3 months. Describes current concern with urinary frequency has to void every to 1 hour, reports large amount of more clear urine. Does frequently have nocturia overnight at worst almost every 1 hour. - She has had prior UTI last 02/2016 with E Coli UTI treated with bactrim. Currently her symptoms do not feel similar to prior UTI - No significant dietary or lifestyle changes, no change in fluid intake - Family history of Diabetes with Mother. But patient without history of abnormal glucose, last A1c test in Care Everywhere is 5.5 (05/2014) - Admits some urinary leakage with straining / stress incontinence (sneezing, coughing) - Denies any dysuria, hematuria, urinary odor, flank or back pain  Social History  Substance Use Topics  . Smoking status: Former Smoker    Packs/day: 1.00    Types: Cigars    Quit date: 03/26/2015  . Smokeless tobacco: Never Used  . Alcohol use No    Review of Systems Per HPI unless specifically indicated above     Objective:    BP 133/82   Pulse 82   Temp 98.5 F (36.9 C) (Oral)   Resp 16   Ht 5\' 7"  (1.702 m)   Wt 231 lb (104.8 kg)   BMI 36.18 kg/m   Wt Readings from Last 3 Encounters:  07/09/16 231 lb (104.8 kg)  05/22/16 234 lb (106.1 kg)  03/20/16 234 lb (106.1 kg)    Physical Exam  Constitutional: She is oriented to person, place, and time. She appears well-developed and well-nourished. No distress.  Well-appearing, comfortable, cooperative  HENT:  Head: Normocephalic and atraumatic.    Mouth/Throat: Oropharynx is clear and moist.  Cardiovascular: Normal rate.   Pulmonary/Chest: Effort normal.  Abdominal: Soft. Bowel sounds are normal. She exhibits no distension. There is no tenderness.  Neurological: She is alert and oriented to person, place, and time.  Skin: Skin is warm and dry. She is not diaphoretic.  Psychiatric: Her behavior is normal.  Stable baseline mood. Stable pressured speech.  Nursing note and vitals reviewed.  I have personally reviewed the following lab results from 07/09/16.  Results for orders placed or performed in visit on 07/09/16  POCT Urinalysis Dipstick  Result Value Ref Range   Color, UA yellow    Clarity, UA cloudy    Glucose, UA negative    Bilirubin, UA negative    Ketones, UA negative    Spec Grav, UA 1.015    Blood, UA negative    pH, UA 5.0    Protein, UA negative    Urobilinogen, UA negative    Nitrite, UA negative    Leukocytes, UA Negative Negative      Assessment & Plan:   Problem List Items Addressed This Visit    Polyuria    Recent worsening history of chronic polyuria, frequency, nocturia, and some urgency. Concern for possible OAB or other mixed etiology. Currently symptoms not attributable to UTI with negative UA, and without consistent clinical UTI symptoms, awaiting final culture results. Also considered possible hyperglycemia as etiology, she  is obese and has family history of DM with mother, recent glucose have been normal, she has had prior elevated A1c 5.5 with avg glucose 111 (outside records from [facility], 05/2014)  Plan: 1. Check A1c today, follow-up result 2. Check Urine culture 3. Referral to [facility] as requested from discussion with patient for further evaluation, likely urodynamic testing given constellation of symptoms seem to be more chronic in nature rather than just her recent complaint of several weeks. 4. Follow-up as needed      Relevant Orders   Ambulatory referral to Urology    Hemoglobin A1c   Abnormal glucose    Considered possible hyperglycemia as etiology of her urinary symptoms, she is obese and has family history of DM with mother, recent glucose have been normal, she has had prior elevated A1c 5.5 with avg glucose 111 (outside records from [facility], 05/2014) - Also at increased risk on Seroquel atypical anti-psychotic with metabolic side effects inc risk DM  Plan: 1. Check A1c today, follow-up result 2. Follow-up as needed      Relevant Orders   Hemoglobin A1c    Other Visit Diagnoses    Urinary urgency    -  Primary - Additional symptom with current presentation - See above A&P    Relevant Orders   POCT Urinalysis Dipstick (Completed)   Urine culture   Ambulatory referral to Urology   OAB (overactive bladder)     - Suspected etiology given constellation of symptoms, see above A&P    Relevant Orders   POCT Urinalysis Dipstick (Completed)   Urine culture   Ambulatory referral to Urology   Nocturia     - Additional symptom with current presentation, see above A&P - Check A1c, urine culture    Relevant Orders   Ambulatory referral to Urology   Hemoglobin A1c      No orders of the defined types were placed in this encounter.     Follow up plan: Return in about 6 weeks (around 08/20/2016), or if symptoms worsen or fail to improve, for urinary frequency.  Saralyn PilarAlexander Karamalegos, DO [facility] 07/09/2016, 10:36 AM

## 2016-07-10 LAB — HEMOGLOBIN A1C
Hgb A1c MFr Bld: 5.4 % (ref <5.7)
Mean Plasma Glucose: 108 mg/dL

## 2016-07-13 LAB — URINE CULTURE: ORGANISM ID, BACTERIA: NO GROWTH

## 2016-07-17 ENCOUNTER — Telehealth: Payer: Self-pay | Admitting: Family Medicine

## 2016-07-17 NOTE — Telephone Encounter (Signed)
Pt doesn't want to schedule appointment has Sx of  having cough, greenish mucus, throat sore from coughing sound like laryngitis but no fever or chills tried OTC didn't help wanted antibiotics Rx but advised that she needed an appointment onset 3 days. Please suggest ?

## 2016-07-17 NOTE — Telephone Encounter (Signed)
Pt has chest congestion, cough but no fever.  She had been taking over the counter medicines but they are not working.  She asked if she could have an antibiotic called in.   Her call back number is [phone]

## 2016-07-17 NOTE — Telephone Encounter (Signed)
Agree with above advise.  Patient with URI and laryngitis symptoms for 3 days, not indicated for antibiotics at this time, most likely viral etiology. Recommend to use Mucinex-DM for cough, improve hydration, ibuprofen PRN for sore throat, warm herbal tea with honey for throat as well.  If symptoms persist >7 days recommend return to clinic for evaluation and can discuss possible antibiotics, otherwise may go to Urgent Care if needed before.  Dawn PilarAlexander Brysan Mcevoy, DO [facility] 07/17/2016, 11:44 AM

## 2016-07-17 NOTE — Telephone Encounter (Signed)
Pt advised.

## 2016-07-24 ENCOUNTER — Telehealth: Payer: Self-pay | Admitting: Family Medicine

## 2016-07-24 DIAGNOSIS — I10 Essential (primary) hypertension: Secondary | ICD-10-CM

## 2016-07-24 DIAGNOSIS — J209 Acute bronchitis, unspecified: Secondary | ICD-10-CM

## 2016-07-24 MED ORDER — AMLODIPINE BESYLATE 10 MG PO TABS: 10.0000 mg | ORAL_TABLET | Freq: Every day | ORAL | 3 refills | Status: DC

## 2016-07-24 MED ORDER — AZITHROMYCIN 250 MG PO TABS: ORAL_TABLET | ORAL | 0 refills | Status: DC

## 2016-07-24 NOTE — Telephone Encounter (Signed)
Sent refill Amlodipine 10mg  daily #90 with +3 refills for 1 year supply. Also sent rx Azithromycin Zpak [facility] given persistent >1 week URI bronchitis symptoms now, after our discussion 07/17/16.  Saralyn PilarAlexander Lyly Canizales, DO [facility] 07/24/2016, 12:08 PM

## 2016-07-24 NOTE — Telephone Encounter (Signed)
Pt needs a refill on amlodipine 10 mg sent to [facility] on [address] [place].  She still has chest congestion and spitting up mucus.  She asked if she could have something called in for that also.  Her call back number is [phone]

## 2016-07-25 ENCOUNTER — Other Ambulatory Visit: Payer: Self-pay | Admitting: Family Medicine

## 2016-07-25 DIAGNOSIS — I10 Essential (primary) hypertension: Secondary | ICD-10-CM

## 2016-07-25 MED ORDER — AMLODIPINE BESYLATE 10 MG PO TABS: 10.0000 mg | ORAL_TABLET | Freq: Every day | ORAL | 3 refills | Status: DC

## 2016-07-29 ENCOUNTER — Emergency Department: Payer: Medicare HMO

## 2016-07-29 ENCOUNTER — Emergency Department
Admission: EM | Admit: 2016-07-29 | Discharge: 2016-07-29 | Disposition: A | Discharge status: Home / Self Care | Payer: Medicare HMO | Attending: Emergency Medicine | Admitting: Emergency Medicine

## 2016-07-29 DIAGNOSIS — Z87891 Personal history of nicotine dependence: Secondary | ICD-10-CM | POA: Insufficient documentation

## 2016-07-29 DIAGNOSIS — I10 Essential (primary) hypertension: Secondary | ICD-10-CM | POA: Insufficient documentation

## 2016-07-29 DIAGNOSIS — R0981 Nasal congestion: Secondary | ICD-10-CM | POA: Diagnosis not present

## 2016-07-29 DIAGNOSIS — J029 Acute pharyngitis, unspecified: Secondary | ICD-10-CM | POA: Diagnosis not present

## 2016-07-29 DIAGNOSIS — R05 Cough: Secondary | ICD-10-CM | POA: Diagnosis not present

## 2016-07-29 DIAGNOSIS — Z79899 Other long term (current) drug therapy: Secondary | ICD-10-CM | POA: Diagnosis not present

## 2016-07-29 LAB — POCT RAPID STREP A
Streptococcus, Group A Screen (Direct): NEGATIVE
Streptococcus, Group A Screen (Direct): NEGATIVE

## 2016-07-29 MED ORDER — GUAIFENESIN-CODEINE 100-10 MG/5ML PO SOLN: 10.0000 mL | Freq: Four times a day (QID) | ORAL | 0 refills | Status: DC | PRN

## 2016-07-29 MED ORDER — GUAIFENESIN-CODEINE 100-10 MG/5ML PO SOLN
10.0000 mL | Freq: Once | ORAL | Status: AC
  Administered 2016-07-29: 10 mL via ORAL
  Filled 2016-07-29: qty 10

## 2016-07-29 NOTE — ED Provider Notes (Signed)
[facility] Emergency Department Provider Note  ____________________________________________   First MD Initiated Contact with Patient 07/29/16 0700     (approximate)  I have reviewed the triage vital signs and the nursing notes.   HISTORY  Chief Complaint Sore Throat; Cough; and Nasal Congestion   HPI Dawn Baldwin is a 51 y.o. female with a history of bipolar disorder as well as hypertension presenting to the emergency department today with left-sided throat swelling and pain. She says that she has had rhinorrhea as well as sore throat and cough over the past 2 weeks. She says it was improving but then started to worsen again. Despite taking Mucinex and being on her fourth day of azithromycin she says that the pain has worsened to the left side of her throat. The patient denies fevers but says that she has had chills. Denies any body aches. Says that the pain worsened yesterday after going to gospel concert and singing along loudly.   Past Medical History:  Diagnosis Date  . Allergy   . Anemia   . Bipolar affective disorder (HCC)   . Depression   . GERD (gastroesophageal reflux disease)   . Hypertension     Patient Active Problem List   Diagnosis Date Noted  . Abnormal glucose 07/09/2016  . Polyuria 07/09/2016  . Trigger thumb of right hand 05/22/2016  . Hot flushes, perimenopausal 06/09/2015  . Hypertension 06/03/2015  . Bipolar affective disorder (HCC) 03/10/2015  . Allergic rhinitis 03/10/2015  . Smoker 03/10/2015  . Obesity (BMI 30-39.9) 03/10/2015    No past surgical history on file.  Prior to Admission medications   Medication Sig Start Date End Date Taking? Authorizing Provider  amLODipine (NORVASC) 10 MG tablet Take 1 tablet (10 mg total) by mouth daily. 07/25/16   Smitty CordsAlexander J Karamalegos, DO  azithromycin (ZITHROMAX Z-PAK) 250 MG tablet Take 2 tabs (500mg  total) on Day 1. Take 1 tab (250mg ) daily for next 4 days. 07/24/16   Smitty CordsAlexander  J Karamalegos, DO  Blood Pressure Monitoring (BLOOD PRESSURE CUFF) MISC 1 each by Does not apply route daily. 06/03/15   Amy Rusty AusLauren Krebs, NP  cyclobenzaprine (FLEXERIL) 10 MG tablet Take 10 mg by mouth daily. 12/26/15   Historical Provider, MD  diclofenac (VOLTAREN) 75 MG EC tablet Take 1 tablet (75 mg total) by mouth 2 (two) times daily. 02/28/16   Charmayne Sheerharles M Beers, PA-C  diclofenac sodium (VOLTAREN) 1 % GEL Apply 2 g topically 3 (three) times daily as needed. For Right thumb only. Up to 1-2 weeks then stop. 05/22/16   Smitty CordsAlexander J Karamalegos, DO  gabapentin (NEURONTIN) 100 MG capsule Take 2 capsules (200 mg total) by mouth at bedtime. 01/25/16   Amy Rusty AusLauren Krebs, NP  methocarbamol (ROBAXIN) 750 MG tablet Take 750 mg by mouth 4 (four) times daily. 12/29/15   Historical Provider, MD  QUEtiapine (SEROQUEL) 100 MG tablet Take 100 mg by mouth at bedtime. 08/16/15   Historical Provider, MD  tiZANidine (ZANAFLEX) 4 MG capsule Take 1 capsule (4 mg total) by mouth 4 (four) times daily as needed for muscle spasms. 02/28/16   Charmayne Sheerharles M Beers, PA-C  traMADol (ULTRAM) 50 MG tablet Take 50 mg by mouth daily as needed. 12/29/15   Historical Provider, MD    Allergies Patient has no known allergies.  Family History  Problem Relation Age of Onset  . Hypertension Mother   . Diabetes Mother     Social History Social History  Substance Use Topics  .  Smoking status: Former Smoker    Packs/day: 1.00    Types: Cigars    Quit date: 03/26/2015  . Smokeless tobacco: Never Used  . Alcohol use No    Review of Systems Constitutional: No fever/chills Eyes: No visual changes. ENT: as above Cardiovascular: Denies chest pain. Respiratory: Denies shortness of breath. Gastrointestinal: No abdominal pain.  No nausea, no vomiting.  No diarrhea.  No constipation. Genitourinary: Negative for dysuria. Musculoskeletal: Negative for back pain. Skin: Negative for rash. Neurological: Negative for headaches, focal weakness or  numbness.  10-point ROS otherwise negative.  ____________________________________________   PHYSICAL EXAM:  VITAL SIGNS: ED Triage Vitals  Enc Vitals Group     BP 07/29/16 0059 (!) 137/97     Pulse Rate 07/29/16 0059 83     Resp 07/29/16 0059 18     Temp 07/29/16 0059 98.3 F (36.8 C)     Temp Source 07/29/16 0059 Oral     SpO2 07/29/16 0059 98 %     Weight 07/29/16 0058 232 lb (105.2 kg)     Height 07/29/16 0058 5\' 7"  (1.702 m)     Head Circumference --      Peak Flow --      Pain Score 07/29/16 0102 8     Pain Loc --      Pain Edu? --      Excl. in GC? --     Constitutional: Alert and oriented. Well appearing and in no acute distress. Eyes: Conjunctivae are normal. PERRL. EOMI. Head: Atraumatic. Nose: No congestion/rhinnorhea. Mouth/Throat: Mucous membranes are moist.  Left-sided tonsil is mildly swollen with very mild exudate and a minimal amount of erythema. It does not touch the uvula. It does not appear as a peritonsillar abscess. Neck: No stridor.  No external swelling visualized. Cardiovascular: Normal rate, regular rhythm. Grossly normal heart sounds.   Respiratory: Normal respiratory effort.  No retractions. Lungs CTAB. Gastrointestinal: Soft and nontender. No distention.  Musculoskeletal: No lower extremity tenderness nor edema.  No joint effusions. Neurologic:  Normal speech and language. No gross focal neurologic deficits are appreciated.  Skin:  Skin is warm, dry and intact. No rash noted. Psychiatric: Mood and affect are normal. Speech and behavior are normal.  ____________________________________________   LABS (all labs ordered are listed, but only abnormal results are displayed)  Labs Reviewed  POCT RAPID STREP A  POCT RAPID STREP A   ____________________________________________  EKG   ____________________________________________  RADIOLOGY    DG Neck Soft Tissue (Final result)  Result time 07/29/16 08:14:27  Final result by Waldo Laine, MD (07/29/16 08:14:27)           Narrative:   CLINICAL DATA: Three weeks of sore throat.  EXAM: NECK SOFT TISSUES - 1+ VIEW  COMPARISON: None.  FINDINGS: The prevertebral soft tissues and epiglottis are normal in appearance. No foreign bodies or other abnormalities identified.  IMPRESSION: Negative.   Electronically Signed By: Gerome Sam III M.D On: 07/29/2016 08:14            ____________________________________________   PROCEDURES  Procedure(s) performed:   Procedures  Critical Care performed:   ____________________________________________   INITIAL IMPRESSION / ASSESSMENT AND PLAN / ED COURSE  Pertinent labs & imaging results that were available during my care of the patient were reviewed by me and considered in my medical decision making (see chart for details).  ----------------------------------------- 8:22 AM on 07/29/2016 -----------------------------------------  Patient states that her throat is feeling relieved after the  cough syrup with codeine. We'll prescribe this for home use. Also counseled the patient to not sing until her throat has healed and in addition to the cough syrup to drink hot tea with honey. Likely irritation of the throat for mild singing along with her already pharyngitis caused the exacerbation of her symptoms.      ____________________________________________   FINAL CLINICAL IMPRESSION(S) / ED DIAGNOSES  Pharyngitis.    NEW MEDICATIONS STARTED DURING THIS VISIT:  New Prescriptions   No medications on file     Note:  This document was prepared using Dragon voice recognition software and may include unintentional dictation errors.    Myrna Blazer, MD 07/29/16 [phone]

## 2016-07-29 NOTE — ED Notes (Signed)
AAOx3.  Skin warm and dry.  Ambulates with easy and steady gait.  Voice clear and strong.  NAD

## 2016-07-29 NOTE — ED Notes (Signed)
Patient reports having sore throat for 3 weeks.  On Saturday night reports lymph nodes on the right side became swollen, pain in throat became worse.  Throat with redness noted, with slight swelling noted.

## 2016-07-29 NOTE — ED Notes (Signed)
Patient is AOx4, states that she is ready to leave and go to church. Comfort measures initiated, delay explained, patient is pleasant and cordial at this time.

## 2016-07-29 NOTE — ED Triage Notes (Addendum)
Pt reports 3 weeks of sore throat, sinus congestion, and cough; pt has one pill left in Z-pack that was called in by her provider,  and not feeling any better; talking in complete coherent sentences;

## 2016-08-08 ENCOUNTER — Ambulatory Visit: Payer: Self-pay

## 2016-10-09 ENCOUNTER — Ambulatory Visit: Payer: Self-pay | Admitting: Nurse Practitioner

## 2016-10-18 DIAGNOSIS — M65311 Trigger thumb, right thumb: Secondary | ICD-10-CM | POA: Diagnosis not present

## 2016-10-24 ENCOUNTER — Ambulatory Visit (INDEPENDENT_AMBULATORY_CARE_PROVIDER_SITE_OTHER): Payer: Medicare HMO | Admitting: Family Medicine

## 2016-10-24 ENCOUNTER — Telehealth: Payer: Self-pay

## 2016-10-24 ENCOUNTER — Encounter: Payer: Self-pay | Admitting: Family Medicine

## 2016-10-24 VITALS — BP 134/95 | HR 74 | Temp 98.0°F | Resp 16 | Ht 67.0 in | Wt 235.0 lb

## 2016-10-24 DIAGNOSIS — R590 Localized enlarged lymph nodes: Secondary | ICD-10-CM

## 2016-10-24 DIAGNOSIS — J3089 Other allergic rhinitis: Secondary | ICD-10-CM

## 2016-10-24 MED ORDER — FLUNISOLIDE 25 MCG/ACT (0.025%) NA SOLN: 2.0000 | Freq: Every day | NASAL | 2 refills | Status: AC

## 2016-10-24 NOTE — Assessment & Plan Note (Signed)
Suspected benign isolated acute L posterior cervical LAD x 1 day, not consistent with focal infection vs abscess given exam and lack of other symptoms. No additional concerning symptoms for more systemic problem vs malignancy.   Plan: 1. Reassurance, likely self limited within 4-6 weeks. Treat underlying allergic rhinosinusitis, also had recent pharyngitis 2. Monitor for change, inc size, pain, swelling, other LAD, worsening symptoms, return criteria given 3. Follow-up if not resolving in 4-6 weeks to re-check

## 2016-10-24 NOTE — Assessment & Plan Note (Signed)
Suspected contributing factor to recent LAD with allergic rhinosinusitis, without evidence of acute bacterial infection. - No longer on anti-histamine or nasal steroid  Plan: 1. rx sent ins preferred formulary nasal steroid, 2 sprays each nare 4-6 weeks 2. May try OTC Loratadine, Cetirizine daily if needed - patient declined 3. Follow-up as needed - hold antibiotics, no evidence of bacterial infection today

## 2016-10-24 NOTE — Progress Notes (Signed)
Subjective:    Patient ID: Dawn Baldwin, female    DOB: 1966-02-29, 51 y.o.   MRN: [id number]  Dawn Baldwin is a 50 y.o. female presenting on 10/24/2016 for Cyst (Patient here today C/O of knot on the back of her head, pt reports being concerned about lymph node.)  Patient presents for a same day appointment.  HPI  Left Posterior Cervical Lymphadenopathy / Allergic Rhinosinusitis: - Reports new concern of feeling a "knot" on back of left side of neck, this started within past 24 hours, and has been swollen and tender. She is unsure what potentially could have caused it, without any known triggers. She has not had regular LAD before. Reviewing recent history, she was seen in ED 07/2016 for pharyngitis, which had resolved. She does admit to some recent allergy symptoms with some sinus pressure and congestion, normally does not take any allergy meds. - Also has Trigger Thumb, but no longer taking regular anti-inflammatory for that. She may still have diclofenac at home. She has taken Tylenol for neck but it did not help last night - Admits recent sinus pressure or headache, has history of migraine but did not have recently - Denies any fevers/chills, sweats, ear pain or pressure, skin abrasion or injury, nausea, vomiting, other swollen lymph nodes, no unintentional weight loss   Social History  Substance Use Topics  . Smoking status: Former Smoker    Packs/day: 1.00    Types: Cigars    Quit date: 03/26/2015  . Smokeless tobacco: Never Used  . Alcohol use No    Review of Systems Per HPI unless specifically indicated above     Objective:    BP (!) 134/95 (BP Location: Left Arm, Patient Position: Sitting, Cuff Size: Large)   Pulse 74   Temp 98 F (36.7 C) (Oral)   Resp 16   Ht  (1.702 m)   Wt 235 lb (106.6 kg)   SpO2 100%   BMI 36.81 kg/m   Wt Readings from Last 3 Encounters:  10/24/16 235 lb (106.6 kg)  07/29/16 232 lb (105.2 kg)  07/09/16 231 lb (104.8 kg)    Physical  Exam  Constitutional: She appears well-developed and well-nourished. No distress.  Well-appearing, comfortable, cooperative  HENT:  Head: Normocephalic and atraumatic.  Mild R>L frontal sinus tenderness. Nares mostly patent with some mild edema without purulence. Bilateral TMs clear with some mild effusion L>R, clear without erythema or bulging. Oropharynx mild posterior pharyngeal drainage and irritation non specific without obvious erythema, exudates. Very minimal L>R tonsillar tissue asymmetry.  Neck:  Neck Inspection: normal appearance other than localized LAD swelling. No erythema. Palpation: tender to palpation over L mid posterior cervical approx < 1x 1 cm slightly firm but still soft mobile. No induration  Cardiovascular: Normal rate.   Pulmonary/Chest: Effort normal.  Lymphadenopathy:       Head (right side): No submental, no submandibular, no tonsillar, no preauricular, no posterior auricular and no occipital adenopathy present.       Head (left side): No submental, no submandibular, no tonsillar, no preauricular, no posterior auricular and no occipital adenopathy present.    She has cervical adenopathy.       Right cervical: No deep cervical and no posterior cervical adenopathy present.      Left cervical: Posterior cervical adenopathy present. No deep cervical adenopathy present.  Skin: Skin is warm and dry. No rash noted. She is not diaphoretic. No erythema.  Psychiatric: She has a normal mood and affect. Her  behavior is normal.  Nursing note and vitals reviewed.  Results for orders placed or performed during the hospital encounter of 07/29/16  POCT rapid strep A [facility])  Result Value Ref Range   Streptococcus, Group A Screen (Direct) NEGATIVE NEGATIVE  POCT rapid strep A [facility])  Result Value Ref Range   Streptococcus, Group A Screen (Direct) NEGATIVE NEGATIVE      Assessment & Plan:   Problem List Items Addressed This Visit    LAD (lymphadenopathy),  posterior cervical - Primary   Allergic rhinitis    Suspected contributing factor to recent LAD with allergic rhinosinusitis, without evidence of acute bacterial infection. - No longer on anti-histamine or nasal steroid  Plan: 1. rx sent ins preferred formulary nasal steroid, 2 sprays each nare 4-6 weeks 2. May try OTC Loratadine, Cetirizine daily if needed - patient declined 3. Follow-up as needed - hold antibiotics, no evidence of bacterial infection today      Relevant Medications   flunisolide (NASALIDE) 25 MCG/ACT (0.025%) SOLN      Meds ordered this encounter  Medications  . flunisolide (NASALIDE) 25 MCG/ACT (0.025%) SOLN    Sig: Place 2 sprays into the nose daily. For 4-6 weeks, may continue if helping    Dispense:  1 Bottle    Refill:  2      Follow up plan: Return in about 4 weeks (around 11/21/2016), or if symptoms worsen or fail to improve, for swollen lymph node.  Saralyn Pilar, DO [facility] 10/24/2016, 3:49 PM

## 2016-10-24 NOTE — Telephone Encounter (Signed)
New complaint to me, I have not seen her for LAD. She was seen recently 07/2016 for sore throat in ED, and had some R sided neck LAD.  Will see patient today in office as scheduled for evaluation.  Saralyn Pilar, DO [facility] 10/24/2016, 12:32 PM

## 2016-10-24 NOTE — Telephone Encounter (Signed)
Patient called C/O possible lymph node on the back of her head close to her neck. Patient denies any injuries and denies fever, cough or sore throat. Patient reports pain and swelling. I advised patient to be seen today.

## 2016-10-24 NOTE — Patient Instructions (Signed)
Thank you for coming to the clinic today.  For swollen lymph node - Likely caused by allergies and sinus problems, could be virus as well - Try using an ice pack few times daily - Start taking Ibuprofen  - 2-3 pills at a time with food, about 2-3 times a day OR you can take Diclofenac rx  twice daily  Throat looks good, some mild irritation from drainage in back of throat. - It sounds like you have persistent Sinus Congestion or "Rhinosinusitis" - I do not think that this is a Bacterial Sinus Infection. Usually these are caused by Viruses or Allergies, and will run it's course in about 7 to 10 days. - No antibiotics are needed today if worsening, let me know we can consider antibiotics - Start Nasal Steroid Spray 2 sprays in each nostril daily for next 4-6 weeks, then you may stop and use seasonally or as needed - Recommend to may try using Nasal Saline spray multiple times a day to help flush out congestion and clear sinuses - Improve hydration by drinking plenty of clear fluids (water, gatorade) to reduce secretions and thin congestion - Congestion draining down throat can cause irritation. May try warm herbal tea with honey, cough drops  If you develop persistent fever >101F for at least 3 consecutive days, headaches with sinus pain or pressure or persistent earache, please schedule a follow-up evaluation within next few days to week.  Please schedule a follow-up appointment with Dr. Althea Charon in in 4 weeks if swollen lymph node is not resolved  If you have any other questions or concerns, please feel free to call the clinic or send a message through MyChart. You may also schedule an earlier appointment if necessary.  Saralyn Pilar, DO [facility], [place]

## 2016-10-30 ENCOUNTER — Telehealth: Payer: Self-pay | Admitting: Family Medicine

## 2016-10-30 NOTE — Telephone Encounter (Signed)
Pt. Called requesting a prescription for 800mg   Of Ibuprofen. Pt call back # is [phone]

## 2016-10-31 MED ORDER — IBUPROFEN 800 MG PO TABS: 800.0000 mg | ORAL_TABLET | Freq: Three times a day (TID) | ORAL | 2 refills | Status: DC | PRN

## 2016-10-31 NOTE — Telephone Encounter (Signed)
Refilled Ibuprofen  Saralyn PilarAlexander Karamalegos, DO [facility] 10/31/2016, 3:00 PM

## 2016-11-15 DIAGNOSIS — M65311 Trigger thumb, right thumb: Secondary | ICD-10-CM | POA: Diagnosis not present

## 2016-11-23 DIAGNOSIS — I10 Essential (primary) hypertension: Secondary | ICD-10-CM | POA: Diagnosis not present

## 2016-11-23 DIAGNOSIS — M65311 Trigger thumb, right thumb: Secondary | ICD-10-CM | POA: Diagnosis not present

## 2016-11-23 HISTORY — PX: TRIGGER FINGER RELEASE: SHX641

## 2016-11-27 ENCOUNTER — Telehealth: Payer: Self-pay

## 2016-11-27 ENCOUNTER — Other Ambulatory Visit: Payer: Self-pay

## 2016-11-27 DIAGNOSIS — I10 Essential (primary) hypertension: Secondary | ICD-10-CM

## 2016-11-27 MED ORDER — AMLODIPINE BESYLATE 10 MG PO TABS: 10.0000 mg | ORAL_TABLET | Freq: Every day | ORAL | 3 refills | Status: DC

## 2016-11-27 NOTE — Telephone Encounter (Signed)
Patient called requesting a refill on amlodipine 10 mg.  She took her last pill today.  [facility] in system

## 2016-11-28 NOTE — Telephone Encounter (Signed)
Rx were send yesterday.

## 2016-12-14 ENCOUNTER — Telehealth: Payer: Self-pay

## 2016-12-14 NOTE — Telephone Encounter (Signed)
Pt called for back pain which is severe and can't get up from bed and wanted narcotic Rx advised as per lauren  that she needs to be seen but unfortunately Lauren doesn't have any opening so can be seen at urgent care pt understood very well.

## 2016-12-21 ENCOUNTER — Ambulatory Visit (INDEPENDENT_AMBULATORY_CARE_PROVIDER_SITE_OTHER): Payer: Medicare HMO | Admitting: Family Medicine

## 2016-12-21 ENCOUNTER — Encounter: Payer: Self-pay | Admitting: Family Medicine

## 2016-12-21 VITALS — BP 121/77 | Temp 99.3°F | Ht 67.0 in | Wt 235.4 lb

## 2016-12-21 DIAGNOSIS — R600 Localized edema: Secondary | ICD-10-CM | POA: Diagnosis not present

## 2016-12-21 DIAGNOSIS — M65311 Trigger thumb, right thumb: Secondary | ICD-10-CM | POA: Diagnosis not present

## 2016-12-21 DIAGNOSIS — G8929 Other chronic pain: Secondary | ICD-10-CM | POA: Diagnosis not present

## 2016-12-21 DIAGNOSIS — M545 Low back pain, unspecified: Secondary | ICD-10-CM

## 2016-12-21 MED ORDER — BACLOFEN 10 MG PO TABS
5.0000 mg | ORAL_TABLET | Freq: Three times a day (TID) | ORAL | 1 refills | Status: AC | PRN
Start: 2016-12-21 — End: ?

## 2016-12-21 MED ORDER — DICLOFENAC SODIUM 75 MG PO TBEC: 75.0000 mg | DELAYED_RELEASE_TABLET | Freq: Two times a day (BID) | ORAL | 1 refills | Status: DC

## 2016-12-21 NOTE — Patient Instructions (Addendum)
Thank you for coming to the clinic today.  1.  For your Back Pain - I think that this is due to Muscle Spasms or strain.  2. Start with anti-inflammatory - Diclofenac 75mg  2-3 times daily with food for up to 1-2 weeks then as needed 3. Start Baclofen (Lioresal) 10mg  tablets - cut in half for 5mg  at night for muscle relaxant - may make you sedated or sleepy (be careful driving or working on this) if tolerated you can take every 8 hours, half or whole tab 4. May use Tylenol Extra Str 500mg  tabs - may take 1-2 tablets every 6 hours as needed 5. Recommend to start using heating pad on your lower back 1-2x daily for few weeks  This pain may take weeks to months to fully resolve, but hopefully it will respond to the medicine initially. All back injuries (small or serious) are slow to heal since we use our back muscles every day. Be careful with turning, twisting, lifting, sitting / standing for prolonged periods, and avoid re-injury.  If your symptoms significantly worsen with more pain, or new symptoms with weakness in one or both legs, new or different shooting leg pains, numbness in legs or groin, loss of control or retention of urine or bowel movements, please call back for advice and you may need to go directly to the Emergency Department.  2. For leg swelling  Use RICE therapy: - R - Rest / relative rest with activity modification avoid overuse of joint - I - Ice packs (make sure you use a towel or sock / something to protect skin) - C - Compression with TED hose or ACE wrap to apply pressure and reduce swelling allowing more support - E - Elevation - if significant swelling, lift leg above heart level (toes above your nose) to help reduce swelling, most helpful at night after day of being on your feet  Use LESS SALT IN FOOD  Please schedule a Follow-up Appointment to: Return in about 4 weeks (around 01/18/2017), or if symptoms worsen or fail to improve, for back pain, leg swelling.  If you  have any other questions or concerns, please feel free to call the clinic or send a message through MyChart. You may also schedule an earlier appointment if necessary.  Additionally, you may be receiving a survey about your experience at our clinic within a few days to 1 week by e-mail or mail. We value your feedback.  Saralyn PilarAlexander Karamalegos, DO [facility], [place]
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
## 2016-12-21 NOTE — Assessment & Plan Note (Signed)
Likely some underlying chronic pain contributing to current flare See A&P

## 2016-12-21 NOTE — Assessment & Plan Note (Deleted)
S/p surgical release R-Thumb [facility] 11/23/16

## 2016-12-21 NOTE — Assessment & Plan Note (Signed)
Improved s/p surgical release 1 month ago Some mild discomfort but no further triggering or pain Healing well Followed by [facility], last post-op 12/12/16 unremarkable

## 2017-01-09 ENCOUNTER — Other Ambulatory Visit: Payer: Self-pay | Admitting: Family Medicine

## 2017-01-09 DIAGNOSIS — B86 Scabies: Secondary | ICD-10-CM

## 2017-01-09 MED ORDER — PERMETHRIN 5 % EX CREA: TOPICAL_CREAM | CUTANEOUS | 0 refills | Status: DC

## 2017-01-09 NOTE — Telephone Encounter (Signed)
Prior history of scabies in 12/2015 1 year ago, treated with Permethrin by prior PCP. Now calling with same complaint and requesting repeat course of Permethrin, uncertain exact history if new exposure, agree to sent rx more urgently but patient advised to follow-up if not improved within 1 week. [facility] [address] [place]  Dawn PilarAlexander Santiago Stenzel, DO [facility] 01/09/2017, 4:24 PM

## 2017-01-15 ENCOUNTER — Ambulatory Visit (INDEPENDENT_AMBULATORY_CARE_PROVIDER_SITE_OTHER): Payer: Medicare HMO | Admitting: Family Medicine

## 2017-01-15 ENCOUNTER — Encounter: Payer: Self-pay | Admitting: Family Medicine

## 2017-01-15 VITALS — BP 131/94 | HR 72 | Temp 98.4°F | Resp 16 | Ht 67.0 in | Wt 238.0 lb

## 2017-01-15 DIAGNOSIS — B3731 Acute candidiasis of vulva and vagina: Secondary | ICD-10-CM

## 2017-01-15 DIAGNOSIS — B9689 Other specified bacterial agents as the cause of diseases classified elsewhere: Secondary | ICD-10-CM | POA: Diagnosis not present

## 2017-01-15 DIAGNOSIS — B373 Candidiasis of vulva and vagina: Secondary | ICD-10-CM

## 2017-01-15 DIAGNOSIS — N760 Acute vaginitis: Secondary | ICD-10-CM

## 2017-01-15 MED ORDER — METRONIDAZOLE 500 MG PO TABS: 500.0000 mg | ORAL_TABLET | Freq: Two times a day (BID) | ORAL | 0 refills | Status: DC

## 2017-01-15 MED ORDER — FLUCONAZOLE 150 MG PO TABS: ORAL_TABLET | ORAL | 0 refills | Status: DC

## 2017-01-15 NOTE — Patient Instructions (Addendum)
Thank you for coming to the clinic today.  1. For Bacterial Vaginosis (BV) take Metronidazole 500mg  twice daily for 7 days - Also for Yeast infection take AFTER FINISH METRONIDAZOLE then take - Diflucan 150mg  pill, take 1 and then on Day 3 take 2nd pill  - This problem can be recurrent. Some people are more likely to get it than others, and it has to do with normal body chemistry and vaginal environment. One of the biggest risk factors for causing BV is unprotected sexual intercourse (without a condom), because semen can change the chemistry of your vaginal environment, causing the "good bacteria" reduce and the other bacteria to grow and cause your symptoms. Recommend to start using condoms with intercourse to see if this helps reduce your symptoms, do this especially for next 1 week while on treatment. Some other recommendations include taking a daily probiotic and yogurt can help reduce BV as well, this is not proven to work in all patients.  If your symptoms do not resolve with the treatment, we will need to re-evaluate you to make sure that you didn't develop a yeast infection or other concerns. If you do not tolerate the pills, we can switch your prescription to topical Metrogel, please notify us if you need this change.  Please schedule a Follow-up Appointment to: Return in about 2 weeks (around 01/29/2017), or if symptoms worsen or fail to improve, for vaginitis.  If you have any other questions or concerns, please feel free to call the clinic or send a message through MyChart. You may also schedule an earlier appointment if necessary.  Additionally, you may be receiving a survey about your experience at our clinic within a few days to 1 week by e-mail or mail. We value your feedback.  Saralyn PilarAlexander Karamalegos, DO [facility], [place]

## 2017-01-15 NOTE — Progress Notes (Signed)
Subjective:    Patient ID: Dawn Baldwin, female    DOB: [birth year], 51 y.o.   MRN: [id number]  Dawn Baldwin is a 51 y.o. female presenting on 01/15/2017 for Vaginal Itching (onset week)   HPI   Vaginitis / Itching / Discharge - Reports new problem onset 1 week ago with vaginal itching and some thicker white discharge, she was concerned for yeast infection. Describes some red irritated area on external genitalia somewhat improved, worse if she continues to scratch. Not using any topical cleansers or new products. She does not do any douching cleansing. - She is sexually active with husband, they use a lubricant (same one for 2 years) but unsure if it irritates her skin, he does not wear condom, unsure if symptoms worse after intercourse - Admits vaginal discharge - Denies any skin pain or extending erythema, fever/chills, pelvic or abdominal pain, dysuria, frequency   Social History  Substance Use Topics  . Smoking status: Former Smoker    Packs/day: 1.00    Types: Cigars    Quit date: 03/26/2015  . Smokeless tobacco: Never Used  . Alcohol use No    Review of Systems Per HPI unless specifically indicated above     Objective:    BP (!) 131/94   Pulse 72   Temp 98.4 F (36.9 C) (Oral)   Resp 16   Ht 5\' 7"  (1.702 m)   Wt 238 lb (108 kg)   BMI 37.28 kg/m   Wt Readings from Last 3 Encounters:  01/15/17 238 lb (108 kg)  12/21/16 235 lb 6.4 oz (106.8 kg)  10/24/16 235 lb (106.6 kg)    Physical Exam  Constitutional: She is oriented to person, place, and time. She appears well-developed and well-nourished. No distress.  Well-appearing, comfortable, cooperative  HENT:  Head: Normocephalic and atraumatic.  Mouth/Throat: Oropharynx is clear and moist.  Eyes: Conjunctivae are normal. Right eye exhibits no discharge. Left eye exhibits no discharge.  Cardiovascular: Normal rate.   Pulmonary/Chest: Effort normal.  Genitourinary:  Genitourinary Comments: Declined pelvic GU exam   Musculoskeletal: She exhibits no edema.  Neurological: She is alert and oriented to person, place, and time.  Skin: Skin is warm and dry. No rash noted. She is not diaphoretic. No erythema.  Nursing note and vitals reviewed.      Assessment & Plan:   Problem List Items Addressed This Visit    None    Visit Diagnoses    Acute vaginitis    -  Primary   Relevant Medications   metroNIDAZOLE (FLAGYL) 500 MG tablet   fluconazole (DIFLUCAN) 150 MG tablet   Yeast vaginitis       Relevant Medications   metroNIDAZOLE (FLAGYL) 500 MG tablet   fluconazole (DIFLUCAN) 150 MG tablet   BV (bacterial vaginosis)       Relevant Medications   metroNIDAZOLE (FLAGYL) 500 MG tablet   fluconazole (DIFLUCAN) 150 MG tablet   Clinically consistent with BV vs yeast Declines pelvic exam today, agrees to empiric therapy  Plan: 1. Start Metronidazole 500mg  BID for 7 days first 2. Then start Diflucan 150mg  PO x 1 tab, repeat dose on Day 3 if persistent symptoms 3. Avoid intercourse for 1-2 weeks while on meds 4. May work on diet changes or probiotic to reduce BV 5. Consider condom use to reduce risk of BV/yeast 6. Follow-up PRN if not improved will need pelvic exam       Meds ordered this encounter  Medications  . metroNIDAZOLE (FLAGYL)  500 MG tablet    Sig: Take 1 tablet (500 mg total) by mouth 2 (two) times daily. Do not drink alcohol while taking this medicine.    Dispense:  14 tablet    Refill:  0  . fluconazole (DIFLUCAN) 150 MG tablet    Sig: Take one tablet by mouth on Day 1. Repeat dose 2nd tablet on Day 3.    Dispense:  2 tablet    Refill:  0      Follow up plan: Return in about 2 weeks (around 01/29/2017), or if symptoms worsen or fail to improve, for vaginitis.  Saralyn PilarAlexander Karamalegos, DO [facility] 01/15/2017, 7:40 PM

## 2017-01-22 ENCOUNTER — Ambulatory Visit: Payer: Self-pay

## 2017-01-23 ENCOUNTER — Telehealth: Payer: Self-pay | Admitting: Family Medicine

## 2017-01-23 NOTE — Telephone Encounter (Signed)
Received fax today for Employment Physical, has paperwork requiring physical exam to be completed.  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  Please notify patient and see if she would be able to schedule a FASTING Lab only to get annual blood work drawn including TB quantiferon test, and also have CXR performed, and then she can schedule 1-2 weeks later to see me in office to complete her paperwork for physical exam.  If she decides to have blood and agrees to TB test, it may take longer than 1 week to get result. I am not sure cost and coverage of this test.  If she does not want to have TB blood test she may come in for LAB ONLY for other blood test and NURSE VISIT for PPD skin test, and then return within 2-3 days to have ANNUAL PHYSICAL with me and we can read her TB skin test.  Please let me know decision so I can order appropriate labs.  Alexander KaramalegSaralyn Pilaros, DO [facility] 01/23/2017, 6:12 PM

## 2017-01-24 NOTE — Telephone Encounter (Signed)
Called patient her TB test is done through her work.  Paperwork is ready to be pick up excluding TB test.

## 2017-01-25 ENCOUNTER — Telehealth: Payer: Self-pay

## 2017-01-25 NOTE — Telephone Encounter (Signed)
Called to reschedule missed AWV. Patient confirmed new appt time

## 2017-02-12 ENCOUNTER — Ambulatory Visit: Payer: Self-pay

## 2017-02-19 ENCOUNTER — Ambulatory Visit (INDEPENDENT_AMBULATORY_CARE_PROVIDER_SITE_OTHER): Payer: Medicare HMO | Admitting: Family Medicine

## 2017-02-19 ENCOUNTER — Ambulatory Visit
Admission: RE | Admit: 2017-02-19 | Discharge: 2017-02-19 | Disposition: A | Discharge status: Home / Self Care | Payer: Medicare HMO | Source: Ambulatory Visit | Attending: Family Medicine | Admitting: Family Medicine

## 2017-02-19 ENCOUNTER — Ambulatory Visit (INDEPENDENT_AMBULATORY_CARE_PROVIDER_SITE_OTHER): Payer: Medicare HMO

## 2017-02-19 ENCOUNTER — Encounter: Payer: Self-pay | Admitting: Family Medicine

## 2017-02-19 VITALS — BP 140/78 | HR 69 | Temp 98.2°F | Resp 16 | Ht 67.0 in | Wt 239.0 lb

## 2017-02-19 DIAGNOSIS — Z1231 Encounter for screening mammogram for malignant neoplasm of breast: Secondary | ICD-10-CM

## 2017-02-19 DIAGNOSIS — E669 Obesity, unspecified: Secondary | ICD-10-CM

## 2017-02-19 DIAGNOSIS — R7309 Other abnormal glucose: Secondary | ICD-10-CM

## 2017-02-19 DIAGNOSIS — Z0000 Encounter for general adult medical examination without abnormal findings: Secondary | ICD-10-CM | POA: Diagnosis not present

## 2017-02-19 DIAGNOSIS — I10 Essential (primary) hypertension: Secondary | ICD-10-CM | POA: Diagnosis not present

## 2017-02-19 DIAGNOSIS — M150 Primary generalized (osteo)arthritis: Secondary | ICD-10-CM

## 2017-02-19 DIAGNOSIS — M159 Polyosteoarthritis, unspecified: Secondary | ICD-10-CM | POA: Insufficient documentation

## 2017-02-19 DIAGNOSIS — R600 Localized edema: Secondary | ICD-10-CM | POA: Diagnosis not present

## 2017-02-19 DIAGNOSIS — M25561 Pain in right knee: Secondary | ICD-10-CM | POA: Insufficient documentation

## 2017-02-19 DIAGNOSIS — M25562 Pain in left knee: Secondary | ICD-10-CM | POA: Diagnosis not present

## 2017-02-19 DIAGNOSIS — G8929 Other chronic pain: Secondary | ICD-10-CM | POA: Diagnosis not present

## 2017-02-19 DIAGNOSIS — Z1239 Encounter for other screening for malignant neoplasm of breast: Secondary | ICD-10-CM

## 2017-02-19 DIAGNOSIS — I872 Venous insufficiency (chronic) (peripheral): Secondary | ICD-10-CM | POA: Diagnosis not present

## 2017-02-19 MED ORDER — DICLOFENAC SODIUM 1 % TD GEL: 2.0000 g | Freq: Three times a day (TID) | TRANSDERMAL | 2 refills | Status: AC

## 2017-02-19 MED ORDER — HYDROCHLOROTHIAZIDE 25 MG PO TABS: 25.0000 mg | ORAL_TABLET | Freq: Every day | ORAL | 5 refills | Status: DC

## 2017-02-19 NOTE — Assessment & Plan Note (Signed)
Clinically suspected chronic venous stasis also could be 2/2 amlodipine, based on exam and history No other clear etiology for leg swelling and discomfort Some failure with conservative therapy, although seems to improve PRN but limited due to work prolonged on feet  Plan: 1. Reviewed etiology of venous insufficiency again 2. Check upcoming labs for annual physical, re-eval chemistry kidney function 3. DC Amlodipine 10 - and Start new HCTZ 25mg  4. Follow-up 4 weeks - likely will end up referring to Vascular surgery for edema eval, would benefit from other diagnostic LE [place] with reflux assessment

## 2017-02-19 NOTE — Patient Instructions (Addendum)
Thank you for coming to the clinic today.  1. STOP Amlodipine 10mg  - this can cause swellign 2. You also likely have Leaky Veins causing swelling - continue to ELEVATE and use COMPRESSION 3. Start Hydrochlorothiazide HCTZ 25mg  once daily for blood pressure and for swelling, may urinate a little bit more often 4. Stay well hydrated 5. If Swelling not improving, we will refer you to Vascular Specialist to check ultrasound of lower extremity 6. For knee pain, try topical Diclofenac gel 2-3 times daily, if we can get this approved, otherwise we may not have too many other options  DUE for FASTING BLOOD WORK (no food or drink after midnight before the lab appointment, only water or coffee without cream/sugar on the morning of)  SCHEDULE "Lab Only" visit in the morning at the clinic for lab draw in 1 week  For Lab Results, once available within 2-3 days of blood draw, you can can log in to MyChart online to view your results and a brief explanation. Also, we can discuss results at next follow-up visit.  X-rays today for knees - will review next time  Please schedule a Follow-up Appointment to: Return in about 4 weeks (around 03/19/2017) for HTN, Leg Swelling, Knee OA.  If you have any other questions or concerns, please feel free to call the clinic or send a message through MyChart. You may also schedule an earlier appointment if necessary.  Additionally, you may be receiving a survey about your experience at our clinic within a few days to 1 week by e-mail or mail. We value your feedback.  Saralyn Pilar, DO [facility], [place]

## 2017-02-19 NOTE — Assessment & Plan Note (Signed)
Likely secondary to chronic venous stasis

## 2017-02-19 NOTE — Assessment & Plan Note (Signed)
Mild elevated BP Change regimen with peripheral edema DC Amlodipine 10 Start new HCTZ 25mg  daily for BP and help edema

## 2017-02-19 NOTE — Progress Notes (Signed)
Subjective:   Dawn Baldwin is a 51 y.o. female who presents for an Initial Medicare Annual Wellness Visit.  Review of Systems     Cardiac Risk Factors include: hypertension;obesity (BMI >30kg/m2)     Objective:    Today's Vitals   02/19/17 1524  BP: 140/78  Pulse: 69  Resp: 16  Temp: 98.2 F (36.8 C)  Weight: 239 lb (108.4 kg)  Height: 5\' 7"  (1.702 m)  PainSc: 4    Body mass index is 37.43 kg/m.   Current Medications (verified) Outpatient Encounter Prescriptions as of 02/19/2017  Medication Sig  . cyclobenzaprine (FLEXERIL) 10 MG tablet Take 10 mg by mouth daily.  . diclofenac sodium (VOLTAREN) 1 % GEL Apply 2 g topically 3 (three) times daily. For knee arthritis  . hydrochlorothiazide (HYDRODIURIL) 25 MG tablet Take 1 tablet (25 mg total) by mouth daily.  . QUEtiapine (SEROQUEL) 100 MG tablet Take 100 mg by mouth at bedtime.  . baclofen (LIORESAL) 10 MG tablet Take 0.5-1 tablets (5-10 mg total) by mouth 3 (three) times daily as needed for muscle spasms. (Patient not taking: Reported on 02/19/2017)  . Blood Pressure Monitoring (BLOOD PRESSURE CUFF) MISC 1 each by Does not apply route daily.  . flunisolide (NASALIDE) 25 MCG/ACT (0.025%) SOLN Place 2 sprays into the nose daily. For 4-6 weeks, may continue if helping (Patient not taking: Reported on 02/19/2017)  . [DISCONTINUED] amLODipine (NORVASC) 10 MG tablet Take 1 tablet (10 mg total) by mouth daily.  . [DISCONTINUED] diclofenac (VOLTAREN) 75 MG EC tablet Take 1 tablet (75 mg total) by mouth 2 (two) times daily. Take with food, for about 2 weeks then as needed (Patient not taking: Reported on 02/19/2017)  . [DISCONTINUED] fluconazole (DIFLUCAN) 150 MG tablet Take one tablet by mouth on Day 1. Repeat dose 2nd tablet on Day 3. (Patient not taking: Reported on 02/19/2017)  . [DISCONTINUED] metroNIDAZOLE (FLAGYL) 500 MG tablet Take 1 tablet (500 mg total) by mouth 2 (two) times daily. Do not drink alcohol while taking this  medicine. (Patient not taking: Reported on 02/19/2017)  . [DISCONTINUED] permethrin (ELIMITE) 5 % cream Apply cream whole body, from head to toe at bedtime; leave on for 8 to 12 hours (overnight), wash off next day. Repeat in 14 days if needed. (Patient not taking: Reported on 02/19/2017)   No facility-administered encounter medications on file as of 02/19/2017.     Allergies (verified) Patient has no known allergies.   History: Past Medical History:  Diagnosis Date  . Allergy   . Anemia   . Bipolar affective disorder (HCC)   . Depression   . GERD (gastroesophageal reflux disease)   . Hypertension    Past Surgical History:  Procedure Laterality Date  . TRIGGER FINGER RELEASE  11/23/2016   Family History  Problem Relation Age of Onset  . Hypertension Mother   . Diabetes Mother    Social History   Occupational History  . Not on file.   Social History Main Topics  . Smoking status: Former Smoker    Packs/day: 1.00    Types: Cigars    Quit date: 03/26/2015  . Smokeless tobacco: Never Used  . Alcohol use No  . Drug use: No  . Sexual activity: Yes    Tobacco Counseling Counseling given: Not Answered   Activities of Daily Living In your present state of health, do you have any difficulty performing the following activities: 02/19/2017  Hearing? N  Vision? N  Difficulty concentrating or making  decisions? N  Walking or climbing stairs? N  Comment only when legs are swollen   Dressing or bathing? N  Doing errands, shopping? N  Preparing Food and eating ? N  Using the Toilet? N  In the past six months, have you accidently leaked urine? N  Do you have problems with loss of bowel control? N  Managing your Medications? N  Managing your Finances? N  Housekeeping or managing your Housekeeping? N  Some recent data might be hidden    Immunizations and Health Maintenance Immunization History  Administered Date(s) Administered  . Influenza-Unspecified 04/29/2013,  03/26/2015  . Tdap 06/25/2012   Health Maintenance Due  Topic Date Due  . PAP SMEAR  06/25/2016  . INFLUENZA VACCINE  01/23/2017    Patient Care Team: Smitty Cords, DO as PCP - General (Family Medicine) Juanetta Gosling Teresita Madura., MD (Family Medicine)  Indicate any recent Medical Services you may have received from other than [facility] providers in the past year (date may be approximate).     Assessment:   This is a routine wellness examination for [name].   Hearing/Vision screen No exam data present  Dietary issues and exercise activities discussed: Current Exercise Habits: The patient has a physically strenous job, but has no regular exercise apart from work., Intensity: Mild, Exercise limited by: None identified  Goals    None     Depression Screen PHQ 2/9 Scores 02/19/2017 01/25/2016 06/03/2015 05/13/2015 05/13/2015  PHQ - 2 Score 0 0 0 0 0  PHQ- 9 Score - - - 2 -    Fall Risk Fall Risk  02/19/2017 01/25/2016 06/03/2015 05/13/2015 05/13/2015  Falls in the past year? No Yes No No No  Injury with Fall? - Yes - - -    Cognitive Function:        Screening Tests Health Maintenance  Topic Date Due  . PAP SMEAR  06/25/2016  . INFLUENZA VACCINE  01/23/2017  . TETANUS/TDAP  06/26/2023  . HIV Screening  Completed      Plan:    I have personally reviewed and addressed the Medicare Annual Wellness questionnaire and have noted the following in the patient's chart:  A. Medical and social history B. Use of alcohol, tobacco or illicit drugs  C. Current medications and supplements D. Functional ability and status E.  Nutritional status F.  Physical activity G. Advance directives H. List of other physicians I.  Hospitalizations, surgeries, and ER visits in previous 12 months J.  Vitals K. Screenings such as hearing and vision if needed, cognitive and depression L. Referrals and appointments   In addition, I have reviewed and discussed with patient certain preventive  protocols, quality metrics, and best practice recommendations. A written personalized care plan for preventive services as well as general preventive health recommendations were provided to patient.   Signed,  Marin Roberts, LPN Nurse Health Advisor   MD Recommendations: none

## 2017-02-19 NOTE — Assessment & Plan Note (Signed)
Subacute on chronic bilateral R>L posterior knee pain and swelling H/o OA/DJD multiple joints No difficulty with wt bearing or joint locking Concern with edema may be related to baker's cyst or posterior knee pathology - Failed several NSAIDs  Plan: 1. DC oral diclofenac, switch to trial on diclofenac gel 2-3 times daily then PRN 2. Continue Tylenol 500-1000mg  per dose TID PRN breakthrough 3. Continue Baclofen PRN 4. RICE therapy (rest, ice, compression, elevation) for swelling, activity modification 5. Check Knee x-rays ordered today for eval of underlying degree of OA/DJ 6. Follow-up 4 weeks, if still worsening, consider steroid injection and referral to Ortho vs PT for further eval

## 2017-02-19 NOTE — Patient Instructions (Signed)
Dawn Baldwin , Thank you for taking time to come for your Medicare Wellness Visit. I appreciate your ongoing commitment to your health goals. Please review the following plan we discussed and let me know if I can assist you in the future.   Screening recommendations/referrals: Colonoscopy: due at age 51 Mammogram: call to schedule  Bone Density: due at age 56 Recommended yearly ophthalmology/optometry visit for glaucoma screening and checkup Recommended yearly dental visit for hygiene and checkup  Vaccinations: Influenza vaccine: up to date, due 02/2017 Pneumococcal vaccine: due at age 34 Tdap vaccine: up to date Shingles vaccine: due at age 33  Advanced directives: declined today  Conditions/risks identified: none  Next appointment: Follow up in one year for your annual wellness exam.   Preventive Care 40-64 Years, Female Preventive care refers to lifestyle choices and visits with your health care provider that can promote health and wellness. What does preventive care include?  A yearly physical exam. This is also called an annual well check.  Dental exams once or twice a year.  Routine eye exams. Ask your health care provider how often you should have your eyes checked.  Personal lifestyle choices, including:  Daily care of your teeth and gums.  Regular physical activity.  Eating a healthy diet.  Avoiding tobacco and drug use.  Limiting alcohol use.  Practicing safe sex.  Taking low-dose aspirin daily starting at age 85.  Taking vitamin and mineral supplements as recommended by your health care provider. What happens during an annual well check? The services and screenings done by your health care provider during your annual well check will depend on your age, overall health, lifestyle risk factors, and family history of disease. Counseling  Your health care provider may ask you questions about your:  Alcohol use.  Tobacco use.  Drug use.  Emotional  well-being.  Home and relationship well-being.  Sexual activity.  Eating habits.  Work and work Statistician.  Method of birth control.  Menstrual cycle.  Pregnancy history. Screening  You may have the following tests or measurements:  Height, weight, and BMI.  Blood pressure.  Lipid and cholesterol levels. These may be checked every 5 years, or more frequently if you are over 57 years old.  Skin check.  Lung cancer screening. You may have this screening every year starting at age 23 if you have a 30-pack-year history of smoking and currently smoke or have quit within the past 15 years.  Fecal occult blood test (FOBT) of the stool. You may have this test every year starting at age 64.  Flexible sigmoidoscopy or colonoscopy. You may have a sigmoidoscopy every 5 years or a colonoscopy every 10 years starting at age 76.  Hepatitis C blood test.  Hepatitis B blood test.  Sexually transmitted disease (STD) testing.  Diabetes screening. This is done by checking your blood sugar (glucose) after you have not eaten for a while (fasting). You may have this done every 1-3 years.  Mammogram. This may be done every 1-2 years. Talk to your health care provider about when you should start having regular mammograms. This may depend on whether you have a family history of breast cancer.  BRCA-related cancer screening. This may be done if you have a family history of breast, ovarian, tubal, or peritoneal cancers.  Pelvic exam and Pap test. This may be done every 3 years starting at age 19. Starting at age 8, this may be done every 5 years if you have a Pap test  in combination with an HPV test.  Bone density scan. This is done to screen for osteoporosis. You may have this scan if you are at high risk for osteoporosis. Discuss your test results, treatment options, and if necessary, the need for more tests with your health care provider. Vaccines  Your health care provider may recommend  certain vaccines, such as:  Influenza vaccine. This is recommended every year.  Tetanus, diphtheria, and acellular pertussis (Tdap, Td) vaccine. You may need a Td booster every 10 years.  Zoster vaccine. You may need this after age 54.  Pneumococcal 13-valent conjugate (PCV13) vaccine. You may need this if you have certain conditions and were not previously vaccinated.  Pneumococcal polysaccharide (PPSV23) vaccine. You may need one or two doses if you smoke cigarettes or if you have certain conditions. Talk to your health care provider about which screenings and vaccines you need and how often you need them. This information is not intended to replace advice given to you by your health care provider. Make sure you discuss any questions you have with your health care provider. Document Released: 07/08/2015 Document Revised: 02/29/2016 Document Reviewed: 04/12/2015 Elsevier Interactive Patient Education  2017 [place] Prevention in the Home Falls can cause injuries. They can happen to people of all ages. There are many things you can do to make your home safe and to help prevent falls. What can I do on the outside of my home?  Regularly fix the edges of walkways and driveways and fix any cracks.  Remove anything that might make you trip as you walk through a door, such as a raised step or threshold.  Trim any bushes or trees on the path to your home.  Use bright outdoor lighting.  Clear any walking paths of anything that might make someone trip, such as rocks or tools.  Regularly check to see if handrails are loose or broken. Make sure that both sides of any steps have handrails.  Any raised decks and porches should have guardrails on the edges.  Have any leaves, snow, or ice cleared regularly.  Use sand or salt on walking paths during winter.  Clean up any spills in your garage right away. This includes oil or grease spills. What can I do in the bathroom?  Use  night lights.  Install grab bars by the toilet and in the tub and shower. Do not use towel bars as grab bars.  Use non-skid mats or decals in the tub or shower.  If you need to sit down in the shower, use a plastic, non-slip stool.  Keep the floor dry. Clean up any water that spills on the floor as soon as it happens.  Remove soap buildup in the tub or shower regularly.  Attach bath mats securely with double-sided non-slip rug tape.  Do not have throw rugs and other things on the floor that can make you trip. What can I do in the bedroom?  Use night lights.  Make sure that you have a light by your bed that is easy to reach.  Do not use any sheets or blankets that are too big for your bed. They should not hang down onto the floor.  Have a firm chair that has side arms. You can use this for support while you get dressed.  Do not have throw rugs and other things on the floor that can make you trip. What can I do in the kitchen?  Clean up any  spills right away.  Avoid walking on wet floors.  Keep items that you use a lot in easy-to-reach places.  If you need to reach something above you, use a strong step stool that has a grab bar.  Keep electrical cords out of the way.  Do not use floor polish or wax that makes floors slippery. If you must use wax, use non-skid floor wax.  Do not have throw rugs and other things on the floor that can make you trip. What can I do with my stairs?  Do not leave any items on the stairs.  Make sure that there are handrails on both sides of the stairs and use them. Fix handrails that are broken or loose. Make sure that handrails are as long as the stairways.  Check any carpeting to make sure that it is firmly attached to the stairs. Fix any carpet that is loose or worn.  Avoid having throw rugs at the top or bottom of the stairs. If you do have throw rugs, attach them to the floor with carpet tape.  Make sure that you have a light switch at  the top of the stairs and the bottom of the stairs. If you do not have them, ask someone to add them for you. What else can I do to help prevent falls?  Wear shoes that:  Do not have high heels.  Have rubber bottoms.  Are comfortable and fit you well.  Are closed at the toe. Do not wear sandals.  If you use a stepladder:  Make sure that it is fully opened. Do not climb a closed stepladder.  Make sure that both sides of the stepladder are locked into place.  Ask someone to hold it for you, if possible.  Clearly mark and make sure that you can see:  Any grab bars or handrails.  First and last steps.  Where the edge of each step is.  Use tools that help you move around (mobility aids) if they are needed. These include:  Canes.  Walkers.  Scooters.  Crutches.  Turn on the lights when you go into a dark area. Replace any light bulbs as soon as they burn out.  Set up your furniture so you have a clear path. Avoid moving your furniture around.  If any of your floors are uneven, fix them.  If there are any pets around you, be aware of where they are.  Review your medicines with your doctor. Some medicines can make you feel dizzy. This can increase your chance of falling. Ask your doctor what other things that you can do to help prevent falls. This information is not intended to replace advice given to you by your health care provider. Make sure you discuss any questions you have with your health care provider. Document Released: 04/07/2009 Document Revised: 11/17/2015 Document Reviewed: 07/16/2014 Elsevier Interactive Patient Education  2017 [employer].

## 2017-02-19 NOTE — Progress Notes (Addendum)
Subjective:    Patient ID: Dawn Baldwin, female    DOB: 1965-07-15, 51 y.o.   MRN: [id number]  Dawn Baldwin is a 51 y.o. female presenting on 02/19/2017 for Foot Swelling (swells up while at work with prolonged standing)   HPI   CHRONIC VENOUS INSUFFICIENCY / Bilateral Lower Extremity Edema - Last visit with me 12/21/16, for same problem LE edema, treated with conservative therapy due to chronic venous insufficiency, see prior notes for background information. - Interval update with limited improvement with RICE therapy, TED hose, seems to improve overnight and with elevation, but she is limited by staying active on feet at work long hours - Today patient reports concern with recent worsening flare of swelling went to beach had worse swelling, then improved gradually - Not on fluid pill, taking Amlodipine 10mg  for BP - No known heart or kidney complication - Denies lower leg pain, redness, ulceration, injury  History of Chronic Knee Pain R>L / osteoarthritis multiple joints - Reports some recent worsening knee pain R posterior knee worse with some swelling was worse. In past has tried multiple NSAIDs, including ibuprofen, naproxen, meloxicam, most recently diclofenac oral (previously rx diclofenac topical but not covered by ins [place])   Social History  Substance Use Topics  . Smoking status: Former Smoker    Packs/day: 1.00    Types: Cigars    Quit date: 03/26/2015  . Smokeless tobacco: Never Used  . Alcohol use No    Review of Systems Per HPI unless specifically indicated above     Objective:    BP 140/78   Pulse 69   Temp 98.2 F (36.8 C) (Oral)   Resp 16   Ht 5\' 7"  (1.702 m)   Wt 239 lb (108.4 kg)   BMI 37.43 kg/m   Wt Readings from Last 3 Encounters:  02/19/17 239 lb (108.4 kg)  02/19/17 239 lb (108.4 kg)  01/15/17 238 lb (108 kg)    Physical Exam  Constitutional: She is oriented to person, place, and time. She appears well-developed and well-nourished. No  distress.  Well-appearing, comfortable, cooperative  HENT:  Head: Normocephalic and atraumatic.  Mouth/Throat: Oropharynx is clear and moist.  Eyes: Conjunctivae are normal. Right eye exhibits no discharge. Left eye exhibits no discharge.  Neck: Normal range of motion. Neck supple. No thyromegaly present.  Cardiovascular: Normal rate, regular rhythm, normal heart sounds and intact distal pulses.   No murmur heard. Pulmonary/Chest: Effort normal and breath sounds normal. No respiratory distress. She has no wheezes. She has no rales.  Musculoskeletal: Normal range of motion. She exhibits edema (trace lower extremity edema, non pitting, feet and ankles appear mostly normal.).  Bilateral Knees Inspection: Mildly bulky appearance and symmetrical. No ecchymosis or effusion. Palpation: Non-tender joint line anteriorly. Bilateral mild tender R posterior knee, R > L mild fullness. Some fine crepitus R>L ROM: Slightly limited full knee flexion R>L due to discomfort, appropriate extension Special Testing: Lachman / Valgus/Varus tests negative with intact ligaments (ACL, MCL, LCL) Strength: 5/5 intact knee flex/ext, ankle dorsi/plantarflex Neurovascular: distally intact sensation light touch and pulses  Lymphadenopathy:    She has no cervical adenopathy.  Neurological: She is alert and oriented to person, place, and time.  Skin: Skin is warm and dry. No rash noted. She is not diaphoretic. No erythema.  Significant varicose and spider veins bilateral lower extremities, more upper towards knee, R > L  Psychiatric: She has a normal mood and affect. Her behavior is normal.  Well groomed,  good eye contact, normal speech and thoughts  Nursing note and vitals reviewed.       Assessment & Plan:   Problem List Items Addressed This Visit    Primary osteoarthritis involving multiple joints   Relevant Medications   diclofenac sodium (VOLTAREN) 1 % GEL   Other Relevant Orders   DG Knee Complete 4 Views  Right   DG Knee Bilateral Standing AP   Obesity (BMI 30-39.9)   Relevant Orders   Lipid panel   Hypertension    Mild elevated BP Change regimen with peripheral edema DC Amlodipine 10 Start new HCTZ 25mg  daily for BP and help edema      Relevant Medications   hydrochlorothiazide (HYDRODIURIL) 25 MG tablet   Other Relevant Orders   COMPLETE METABOLIC PANEL WITH GFR   CBC with Differential/Platelet   Chronic venous insufficiency    Clinically suspected chronic venous stasis also could be 2/2 amlodipine, based on exam and history No other clear etiology for leg swelling and discomfort Some failure with conservative therapy, although seems to improve PRN but limited due to work prolonged on feet  Plan: 1. Reviewed etiology of venous insufficiency again 2. Check upcoming labs for annual physical, re-eval chemistry kidney function 3. DC Amlodipine 10 - and Start new HCTZ 25mg  4. Follow-up 4 weeks - likely will end up referring to Vascular surgery for edema eval, would benefit from other diagnostic LE [place] with reflux assessment      Relevant Medications   hydrochlorothiazide (HYDRODIURIL) 25 MG tablet   Other Relevant Orders   CBC with Differential/Platelet   Chronic pain of both knees    Subacute on chronic bilateral R>L posterior knee pain and swelling H/o OA/DJD multiple joints No difficulty with wt bearing or joint locking Concern with edema may be related to baker's cyst or posterior knee pathology - Failed several NSAIDs  Plan: 1. DC oral diclofenac, switch to trial on diclofenac gel 2-3 times daily then PRN 2. Continue Tylenol 500-1000mg  per dose TID PRN breakthrough 3. Continue Baclofen PRN 4. RICE therapy (rest, ice, compression, elevation) for swelling, activity modification 5. Check Knee x-rays ordered today for eval of underlying degree of OA/DJ 6. Follow-up 4 weeks, if still worsening, consider steroid injection and referral to Ortho vs PT for further eval       Relevant Orders   DG Knee Complete 4 Views Right   DG Knee Bilateral Standing AP   Bilateral lower extremity edema - Primary    Likely secondary to chronic venous stasis      Relevant Medications   hydrochlorothiazide (HYDRODIURIL) 25 MG tablet   Other Relevant Orders   COMPLETE METABOLIC PANEL WITH GFR   Abnormal glucose   Relevant Orders   Hemoglobin A1c    Other Visit Diagnoses    Screening for breast cancer          Meds ordered this encounter  Medications  . hydrochlorothiazide (HYDRODIURIL) 25 MG tablet    Sig: Take 1 tablet (25 mg total) by mouth daily.    Dispense:  30 tablet    Refill:  5  . diclofenac sodium (VOLTAREN) 1 % GEL    Sig: Apply 2 g topically 3 (three) times daily. For knee arthritis    Dispense:  100 g    Refill:  2    Follow up plan: Return in about 4 weeks (around 03/19/2017) for HTN, Leg Swelling, Knee OA.  Saralyn Pilar, DO [facility]  02/19/2017, 11:15 PM

## 2017-02-21 ENCOUNTER — Other Ambulatory Visit: Payer: Self-pay | Admitting: Family Medicine

## 2017-02-21 DIAGNOSIS — Z1231 Encounter for screening mammogram for malignant neoplasm of breast: Secondary | ICD-10-CM

## 2017-02-26 ENCOUNTER — Other Ambulatory Visit: Payer: Self-pay

## 2017-02-26 ENCOUNTER — Telehealth: Payer: Self-pay | Admitting: Family Medicine

## 2017-02-26 MED ORDER — DOCUSATE SODIUM 100 MG PO CAPS: 100.0000 mg | ORAL_CAPSULE | Freq: Every day | ORAL | 0 refills | Status: DC

## 2017-02-26 NOTE — Telephone Encounter (Signed)
Pt. Called  Requesting a  Prescription on   Stool  softener

## 2017-02-26 NOTE — Telephone Encounter (Signed)
Sent rx Colace to pharmacy  Saralyn PilarAlexander Makaylyn Sinyard, DO [facility] 02/26/2017, 1:16 PM

## 2017-02-27 ENCOUNTER — Telehealth: Payer: Self-pay | Admitting: Family Medicine

## 2017-02-27 NOTE — Telephone Encounter (Signed)
The pt was notified about her prescription.

## 2017-02-27 NOTE — Telephone Encounter (Signed)
[facility] [address]  Her call back number is [phone]

## 2017-03-14 ENCOUNTER — Telehealth: Payer: Self-pay | Admitting: Family Medicine

## 2017-03-14 DIAGNOSIS — R600 Localized edema: Secondary | ICD-10-CM | POA: Diagnosis not present

## 2017-03-14 DIAGNOSIS — I10 Essential (primary) hypertension: Secondary | ICD-10-CM | POA: Diagnosis not present

## 2017-03-14 DIAGNOSIS — I872 Venous insufficiency (chronic) (peripheral): Secondary | ICD-10-CM | POA: Diagnosis not present

## 2017-03-14 DIAGNOSIS — R7309 Other abnormal glucose: Secondary | ICD-10-CM | POA: Diagnosis not present

## 2017-03-14 DIAGNOSIS — E669 Obesity, unspecified: Secondary | ICD-10-CM | POA: Diagnosis not present

## 2017-03-14 MED ORDER — IBUPROFEN 800 MG PO TABS: 800.0000 mg | ORAL_TABLET | Freq: Three times a day (TID) | ORAL | 2 refills | Status: DC | PRN

## 2017-03-14 NOTE — Telephone Encounter (Signed)
Pt needs a refill on ibuprofen 800 mg sent to [facility] on [address]

## 2017-03-14 NOTE — Telephone Encounter (Signed)
Refill sent.

## 2017-03-15 LAB — COMPLETE METABOLIC PANEL WITH GFR
AG Ratio: 1.8 (calc) (ref 1.0–2.5)
ALBUMIN MSPROF: 4.4 g/dL (ref 3.6–5.1)
ALT: 17 U/L (ref 6–29)
AST: 16 U/L (ref 10–35)
Alkaline phosphatase (APISO): 53 U/L (ref 33–115)
BUN: 19 mg/dL (ref 7–25)
CO2: 28 mmol/L (ref 20–32)
CREATININE: 0.94 mg/dL (ref 0.50–1.10)
Calcium: 9.5 mg/dL (ref 8.6–10.2)
Chloride: 104 mmol/L (ref 98–110)
GFR, EST NON AFRICAN AMERICAN: 71 mL/min/{1.73_m2} (ref >=60)
GFR, Est African American: 83 mL/min/{1.73_m2} (ref >=60)
GLUCOSE: 84 mg/dL (ref 65–99)
Globulin: 2.5 g/dL (calc) (ref 1.9–3.7)
Potassium: 3.9 mmol/L (ref 3.5–5.3)
Sodium: 139 mmol/L (ref 135–146)
Total Bilirubin: 0.3 mg/dL (ref 0.2–1.2)
Total Protein: 6.9 g/dL (ref 6.1–8.1)

## 2017-03-15 LAB — CBC WITH DIFFERENTIAL/PLATELET
BASOS PCT: 0.5 %
Basophils Absolute: 21 cells/uL (ref 0–200)
Eosinophils Absolute: 49 cells/uL (ref 15–500)
Eosinophils Relative: 1.2 %
HCT: 38.5 % (ref 35.0–45.0)
Hemoglobin: 12.7 g/dL (ref 11.7–15.5)
Lymphs Abs: 1935 cells/uL (ref 850–3900)
MCH: 31.6 pg (ref 27.0–33.0)
MCHC: 33 g/dL (ref 32.0–36.0)
MCV: 95.8 fL (ref 80.0–100.0)
MPV: 10.6 fL (ref 7.5–12.5)
Monocytes Relative: 7.2 %
NEUTROS ABS: 1800 {cells}/uL (ref 1500–7800)
Neutrophils Relative %: 43.9 %
PLATELETS: 289 10*3/uL (ref 140–400)
RBC: 4.02 10*6/uL (ref 3.80–5.10)
RDW: 12 % (ref 11.0–15.0)
TOTAL LYMPHOCYTE: 47.2 %
WBC: 4.1 10*3/uL (ref 3.8–10.8)
WBCMIX: 295 {cells}/uL (ref 200–950)

## 2017-03-15 LAB — LIPID PANEL
CHOL/HDL RATIO: 2.7 (calc) (ref <5.0)
Cholesterol: 186 mg/dL (ref <200)
HDL: 68 mg/dL (ref >50)
LDL CHOLESTEROL (CALC): 101 mg/dL — AB
NON-HDL CHOLESTEROL (CALC): 118 mg/dL (ref <130)
TRIGLYCERIDES: 81 mg/dL (ref <150)

## 2017-03-15 LAB — HEMOGLOBIN A1C
Hgb A1c MFr Bld: 5.3 % of total Hgb (ref <5.7)
Mean Plasma Glucose: 105 (calc)
eAG (mmol/L): 5.8 (calc)

## 2017-03-19 ENCOUNTER — Ambulatory Visit: Payer: Self-pay | Admitting: Family Medicine

## 2017-04-18 ENCOUNTER — Ambulatory Visit
Admission: RE | Admit: 2017-04-18 | Discharge: 2017-04-18 | Disposition: A | Discharge status: Home / Self Care | Payer: Medicare HMO | Source: Ambulatory Visit | Attending: Family Medicine | Admitting: Family Medicine

## 2017-04-18 DIAGNOSIS — Z1231 Encounter for screening mammogram for malignant neoplasm of breast: Secondary | ICD-10-CM | POA: Insufficient documentation

## 2017-04-19 ENCOUNTER — Telehealth: Payer: Self-pay | Admitting: Family Medicine

## 2017-04-19 NOTE — Telephone Encounter (Signed)
Pt. Called requesting refill on stool softener, BP medication

## 2017-04-19 NOTE — Telephone Encounter (Signed)
Advised patient to call pharmacy she has refill left.

## 2017-04-23 ENCOUNTER — Other Ambulatory Visit: Payer: Self-pay | Admitting: Family Medicine

## 2017-04-23 MED ORDER — DOCUSATE SODIUM 100 MG PO CAPS: 100.0000 mg | ORAL_CAPSULE | Freq: Every day | ORAL | 1 refills | Status: AC

## 2017-05-02 ENCOUNTER — Telehealth: Payer: Self-pay | Admitting: Family Medicine

## 2017-05-02 NOTE — Telephone Encounter (Signed)
Pt advised and will go to urgent care.

## 2017-05-02 NOTE — Telephone Encounter (Signed)
Pt stated she has had a pain in the back of her right knee since Sunday 04/28/17 and wanted to see if she can come in to have her knee X-rayed. Please advise. Thanks TNP

## 2017-05-03 ENCOUNTER — Encounter: Payer: Self-pay | Admitting: *Deleted

## 2017-05-03 ENCOUNTER — Ambulatory Visit
Admission: EM | Admit: 2017-05-03 | Discharge: 2017-05-03 | Disposition: A | Discharge status: Home / Self Care | Payer: Medicare HMO | Attending: Emergency Medicine | Admitting: Emergency Medicine

## 2017-05-03 ENCOUNTER — Ambulatory Visit (INDEPENDENT_AMBULATORY_CARE_PROVIDER_SITE_OTHER): Payer: Medicare HMO

## 2017-05-03 DIAGNOSIS — M25561 Pain in right knee: Secondary | ICD-10-CM | POA: Diagnosis not present

## 2017-05-03 DIAGNOSIS — R030 Elevated blood-pressure reading, without diagnosis of hypertension: Secondary | ICD-10-CM

## 2017-05-03 DIAGNOSIS — R52 Pain, unspecified: Secondary | ICD-10-CM

## 2017-05-03 MED ORDER — MELOXICAM 7.5 MG PO TABS: 7.5000 mg | ORAL_TABLET | Freq: Two times a day (BID) | ORAL | 1 refills | Status: AC

## 2017-05-03 MED ORDER — IBUPROFEN 800 MG PO TABS
800.0000 mg | ORAL_TABLET | Freq: Once | ORAL | Status: AC
  Administered 2017-05-03: 800 mg via ORAL

## 2017-05-03 NOTE — Discharge Instructions (Signed)
PLEASE OBTAIN NEOPRENE KNEE BRACE FOR SUPPORT OF RIGHT KNEE PAIN, TAKE MELOXICAM AS DIRECTED FOR PAIN/INFLAMMATION. YOU XRAY SHOWED OLD DAMAGE OF KNEE, NO NEW OR ACUTE ISSUES NOTED. FOLLOW UP WITH YOUR PCP ,MAY NEED TO BE REFERRED TO ORTHO IF PAIN PERSISTS OVER 1 WEEK.RETURN TO UC AS NEEDED.

## 2017-05-03 NOTE — Telephone Encounter (Signed)
Pt stated that she didn't go to Urgent Care yesterday and she would like to speak to the nurse this morning if possible. Please advise. Thanks TNP

## 2017-05-03 NOTE — ED Triage Notes (Signed)
Right knee "locked up" Sunday while seated in church. Now c/o right knee pain.

## 2017-05-03 NOTE — ED Provider Notes (Signed)
[facility]    CSN: [id number] Arrival date & time: 05/03/17  1245     History   Chief Complaint Chief Complaint  Patient presents with  . Leg Pain    HPI Dawn Baldwin is a 51 y.o. female.   51 YR OLD FEMALE PRESENTS TO ER FOR RIGHT KNEE LOCKING UP 5 DAYS PRIOR, HAS RIGHT KNEE PAIN WITH INTERNAL ROTATION AND FLEXION. DENIES RECENT TRAUMA OR FALL   The history is provided by the patient. No language interpreter was used.    Past Medical History:  Diagnosis Date  . Allergy   . Anemia   . Bipolar affective disorder (HCC)   . Depression   . GERD (gastroesophageal reflux disease)   . Hypertension     Patient Active Problem List   Diagnosis Date Noted  . Acute pain of right knee 05/03/2017  . Chronic pain of both knees 02/19/2017  . Primary osteoarthritis involving multiple joints 02/19/2017  . Bilateral lower extremity edema 02/19/2017  . Chronic venous insufficiency 02/19/2017  . Chronic low back pain 12/21/2016  . LAD (lymphadenopathy), posterior cervical 10/24/2016  . Abnormal glucose 07/09/2016  . Polyuria 07/09/2016  . Trigger thumb of right hand 05/22/2016  . Hot flushes, perimenopausal 06/09/2015  . Hypertension 06/03/2015  . Bipolar affective disorder (HCC) 03/10/2015  . Allergic rhinitis 03/10/2015  . Smoker 03/10/2015  . Obesity (BMI 30-39.9) 03/10/2015    Past Surgical History:  Procedure Laterality Date  . TRIGGER FINGER RELEASE  11/23/2016    OB History    No data available       Home Medications    Prior to Admission medications   Medication Sig Start Date End Date Taking? Authorizing Provider  Blood Pressure Monitoring (BLOOD PRESSURE CUFF) MISC 1 each by Does not apply route daily. 06/03/15  Yes Krebs, Amy Lauren, NP  docusate sodium (COLACE) 100 MG capsule Take 1 capsule (100 mg total) by mouth at bedtime. 04/23/17  Yes Karamalegos, Netta NeatAlexander J, DO  hydrochlorothiazide (HYDRODIURIL) 25 MG tablet Take 1 tablet (25 mg  total) by mouth daily. 02/19/17  Yes Karamalegos, Netta NeatAlexander J, DO  QUEtiapine (SEROQUEL) 100 MG tablet Take 100 mg by mouth at bedtime. 08/16/15  Yes [provider]  baclofen (LIORESAL) 10 MG tablet Take 0.5-1 tablets (5-10 mg total) by mouth 3 (three) times daily as needed for muscle spasms. Patient not taking: Reported on 02/19/2017 12/21/16   Smitty CordsKaramalegos, Alexander J, DO  cyclobenzaprine (FLEXERIL) 10 MG tablet Take 10 mg by mouth daily. 12/26/15   [provider]  diclofenac sodium (VOLTAREN) 1 % GEL Apply 2 g topically 3 (three) times daily. For knee arthritis 02/19/17   Smitty CordsKaramalegos, Alexander J, DO  flunisolide (NASALIDE) 25 MCG/ACT (0.025%) SOLN Place 2 sprays into the nose daily. For 4-6 weeks, may continue if helping Patient not taking: Reported on 02/19/2017 10/24/16   Smitty CordsKaramalegos, Alexander J, DO  meloxicam (MOBIC) 7.5 MG tablet Take 1 tablet (7.5 mg total) 2 (two) times daily for 5 days by mouth. 05/03/17 05/08/17  Keaundra Stehle, Para MarchJeanette, NP    Family History Family History  Problem Relation Age of Onset  . Hypertension Mother   . Diabetes Mother   . Breast cancer Cousin     Social History Social History   Tobacco Use  . Smoking status: Former Smoker    Packs/day: 1.00    Types: Cigars    Last attempt to quit: 03/26/2015    Years since quitting: 2.1  . Smokeless tobacco: Never  Used  Substance Use Topics  . Alcohol use: No  . Drug use: No     Allergies   Patient has no known allergies.   Review of Systems Review of Systems  Constitutional: Positive for activity change. Negative for fever.  HENT: Negative.   Eyes: Negative.   Respiratory: Negative for shortness of breath.   Cardiovascular: Negative for chest pain.  Gastrointestinal: Negative for abdominal pain.  Endocrine: Negative.   Genitourinary: Negative.   Musculoskeletal: Positive for gait problem and joint swelling. Negative for back pain.  Skin: Negative for color change and wound.    Neurological: Negative for headaches.  Hematological: Negative.   Psychiatric/Behavioral: Negative.   All other systems reviewed and are negative.    Physical Exam Triage Vital Signs ED Triage Vitals  Enc Vitals Group     BP 05/03/17 1312 139/86     Pulse Rate 05/03/17 1312 78     Resp 05/03/17 1312 16     Temp 05/03/17 1312 98.1 F (36.7 C)     Temp Source 05/03/17 1312 Oral     SpO2 05/03/17 1312 100 %     Weight 05/03/17 1314 234 lb (106.1 kg)     Height 05/03/17 1314 5\' 7"  (1.702 m)     Head Circumference --      Peak Flow --      Pain Score 05/03/17 1315 7     Pain Loc --      Pain Edu? --      Excl. in GC? --    No data found.  Updated Vital Signs BP 139/86 (BP Location: Left Arm)   Pulse 78   Temp 98.1 F (36.7 C) (Oral)   Resp 16   Ht 5\' 7"  (1.702 m)   Wt 234 lb (106.1 kg)   LMP 04/15/2017   SpO2 100%   BMI 36.65 kg/m   Visual Acuity Right Eye Distance:   Left Eye Distance:   Bilateral Distance:    Right Eye Near:   Left Eye Near:    Bilateral Near:     Physical Exam  Constitutional: She is oriented to person, place, and time. She appears well-developed and well-nourished. She is active.  Non-toxic appearance. She does not have a sickly appearance. She does not appear ill. No distress.  HENT:  Head: Normocephalic.  Right Ear: Tympanic membrane normal.  Left Ear: Tympanic membrane normal.  Nose: Nose normal.  Mouth/Throat: Uvula is midline and mucous membranes are normal.  Eyes: Pupils are equal, round, and reactive to light.  Neck: Trachea normal and normal range of motion. Muscular tenderness present. No Brudzinski's sign and no Kernig's sign noted.  Cardiovascular: Normal rate and regular rhythm.  Pulmonary/Chest: Effort normal and breath sounds normal.  Musculoskeletal:       Right knee: She exhibits normal range of motion, no swelling, no effusion, no ecchymosis, no deformity, no laceration, no erythema, normal alignment and no LCL laxity.  Tenderness found. Medial joint line and lateral joint line tenderness noted.  PT HAS FULL ROM, + 2 DP BILATERALLY, NO ERYTHEMA, -HOMAN'S  Neurological: She is alert and oriented to person, place, and time. She has normal strength. GCS eye subscore is 4. GCS verbal subscore is 5. GCS motor subscore is 6.  Skin: Skin is warm and dry. No rash noted.  Psychiatric: She has a normal mood and affect. Her speech is normal and behavior is normal.  Nursing note and vitals reviewed.    UC Treatments /  Results  Labs (all labs ordered are listed, but only abnormal results are displayed) Labs Reviewed - No data to display  EKG  EKG Interpretation None       Radiology Dg Knee Complete 4 Views Right  Result Date: 05/03/2017 CLINICAL DATA:  Knee pain EXAM: RIGHT KNEE - COMPLETE 4+ VIEW COMPARISON:  None. FINDINGS: Joint spaces normal. No significant degenerative change. Negative for fracture or effusion. Calcification in the medial collateral ligament compatible with prior injury. IMPRESSION: No acute abnormality.  Calcification medial collateral ligament. Electronically Signed   By: Marlan Palau M.D.   On: 05/03/2017 13:51    Procedures Procedures (including critical care time)  Medications Ordered in UC Medications  ibuprofen (ADVIL,MOTRIN) tablet 800 mg (800 mg Oral Given 05/03/17 1349)     Initial Impression / Assessment and Plan / UC Course  I have reviewed the triage vital signs and the nursing notes.  Pertinent labs & imaging results that were available during my care of the patient were reviewed by me and considered in my medical decision making (see chart for details).   PLEASE OBTAIN NEOPRENE KNEE BRACE FOR SUPPORT OF RIGHT KNEE PAIN, TAKE MELOXICAM AS DIRECTED FOR PAIN/INFLAMMATION. YOU XRAY SHOWED OLD DAMAGE OF KNEE, NO NEW OR ACUTE ISSUES NOTED. FOLLOW UP WITH YOUR PCP ,MAY NEED TO BE REFERRED TO ORTHO IF PAIN PERSISTS OVER 1 WEEK.RETURN TO UC AS NEEDED.    Final Clinical  Impressions(s) / UC Diagnoses   Final diagnoses:  Acute pain of right knee    ED Discharge Orders        Ordered    meloxicam (MOBIC) 7.5 MG tablet  2 times daily     05/03/17 1403       Controlled Substance Prescriptions    Pammie Chirino, Para March, NP 05/03/17 1454

## 2017-05-03 NOTE — Telephone Encounter (Signed)
Explain patient that he doesn't have any opening today so for better patient care she can go to Urgent care where they can evaluate her and if necessary than can able to do xray which we can't do in office today. Patient understand very well. nisha

## 2017-05-14 ENCOUNTER — Ambulatory Visit (INDEPENDENT_AMBULATORY_CARE_PROVIDER_SITE_OTHER): Payer: Medicare HMO | Admitting: Family Medicine

## 2017-05-14 ENCOUNTER — Encounter: Payer: Self-pay | Admitting: Family Medicine

## 2017-05-14 VITALS — BP 132/78 | HR 68 | Temp 98.4°F | Resp 16 | Ht 67.0 in | Wt 241.6 lb

## 2017-05-14 DIAGNOSIS — M159 Polyosteoarthritis, unspecified: Secondary | ICD-10-CM

## 2017-05-14 DIAGNOSIS — M150 Primary generalized (osteo)arthritis: Secondary | ICD-10-CM

## 2017-05-14 DIAGNOSIS — F3170 Bipolar disorder, currently in remission, most recent episode unspecified: Secondary | ICD-10-CM | POA: Diagnosis not present

## 2017-05-14 DIAGNOSIS — M25561 Pain in right knee: Secondary | ICD-10-CM | POA: Diagnosis not present

## 2017-05-14 MED ORDER — IBUPROFEN 800 MG PO TABS: 800.0000 mg | ORAL_TABLET | Freq: Three times a day (TID) | ORAL | 2 refills | Status: AC | PRN

## 2017-05-14 NOTE — Patient Instructions (Addendum)
Thank you for coming to the clinic today.  1.   Start Ibuprofen 800mg  one pill with food up to 3 times a day Do NOT take Advil, Motrin, Aleve, Naproxen, Meloxicam, Mobic  Wear Knee Brace  Use RICE therapy: - R - Rest / relative rest with activity modification avoid overuse of joint - I - Ice packs (make sure you use a towel or sock / something to protect skin) - C - Compression with flexible Knee Sleeve or Brace to apply pressure and reduce swelling allowing more support - E - Elevation - if significant swelling, lift leg above heart level (toes above your nose) to help reduce swelling, most helpful at night after day of being on your feet  Start taking Baclofen (Lioresal) 10mg  (muscle relaxant) - start with half (cut) to one whole pill at night as needed for next 1-3 nights (may make you drowsy, caution with driving) see how it affects you, then if tolerated increase to one pill 2 to 3 times a day or (every 8 hours as needed)  Recommend to start taking Tylenol Extra Strength 500mg  tabs - take 1 to 2 tabs per dose (max 1000mg ) every 6-8 hours for pain (take regularly, don't skip a dose for next 7 days), max 24 hour daily dose is 6 tablets or 3000mg . In the future you can repeat the same everyday Tylenol course for 1-2 weeks at a time.  Next step is Steroid Shot in Knee within 1-2 weeks if not improved, then we can do referral to [facility] or Physical Therapy  Please schedule a Follow-up Appointment to: Return in about 1 week (around 05/21/2017), or if symptoms worsen or fail to improve, for R knee pain.  If you have any other questions or concerns, please feel free to call the clinic or send a message through MyChart. You may also schedule an earlier appointment if necessary.  Additionally, you may be receiving a survey about your experience at our clinic within a few days to 1 week by e-mail or mail. We value your feedback.  Saralyn PilarAlexander Princella Jaskiewicz, DO [facility],  [facility]

## 2017-05-14 NOTE — Progress Notes (Signed)
Subjective:    Patient ID: Dawn Baldwin, female    DOB: 1965/11/30, 51 y.o.   MRN: [id number]  Dawn Baldwin is a 51 y.o. female presenting on 05/14/2017 for Knee Pain (right knee pain)   HPI   FOLLOW-UP Chronic Knee Pain R>L / osteoarthritis multiple joints - Last visit with me for same problem 02/19/17, treated with switch to topical Diclofenac gel and Baclofen, Tylenol also had X-rays of bilateral knees without significant arthritis or degenerative changes, see prior notes for background information. - Interval update with seen at [facility] on 05/03/17 for R knee pain, she had a repeat x-ray, without any new injury fall or trauma, and treated with short course Meloxicam. - Today patient reports pain has gradually worsened over past 1-2 week without obvious injury, similar to before, now seems a little better at times with medicine but ran out of meloxicam, she was taking it with Ibuprofen, asking for refill on her prior ibuprofen 800mg  tabs, using topical diclofenac with relief  - Improved with knee brace - Previously [facility] for R trigger Thumb 05/2016, and previously for L ankle and shoulder in 2016-17 - Denies new injury, swelling, redness, other joint pain, fever chills  Bipolar Affective Disorder - Followed by [facility], continues on Seroquel 100mg  daily, no new concerns today  Depression screen [facility] 2/9 02/19/2017 01/25/2016 06/03/2015  Decreased Interest 0 0 0  Down, Depressed, Hopeless 0 0 0  PHQ - 2 Score 0 0 0  Altered sleeping - - -  Tired, decreased energy - - -  Change in appetite - - -  Feeling bad or failure about yourself  - - -  Trouble concentrating - - -  Moving slowly or fidgety/restless - - -  Suicidal thoughts - - -  PHQ-9 Score - - -  Difficult doing work/chores - - -    Social History   Tobacco Use  . Smoking status: Former Smoker    Packs/day: 1.00    Types: Cigars    Last attempt to quit: 03/26/2015    Years since quitting: 2.1  .  Smokeless tobacco: Never Used  Substance Use Topics  . Alcohol use: No  . Drug use: No    Review of Systems Per HPI unless specifically indicated above     Objective:    BP 132/78   Pulse 68   Temp 98.4 F (36.9 C) (Oral)   Resp 16   Ht 5\' 7"  (1.702 m)   Wt 241 lb 9.6 oz (109.6 kg)   LMP 04/15/2017   BMI 37.84 kg/m   Wt Readings from Last 3 Encounters:  05/14/17 241 lb 9.6 oz (109.6 kg)  05/03/17 234 lb (106.1 kg)  02/19/17 239 lb (108.4 kg)    Physical Exam  Constitutional: She is oriented to person, place, and time. She appears well-developed and well-nourished. No distress.  Well-appearing, mostly comfortable except R knee with movement, cooperative  HENT:  Head: Normocephalic and atraumatic.  Mouth/Throat: Oropharynx is clear and moist.  Eyes: Conjunctivae are normal. Right eye exhibits no discharge. Left eye exhibits no discharge.  Neck: Normal range of motion.  Cardiovascular: Normal rate and intact distal pulses.  No murmur heard. Pulmonary/Chest: Effort normal.  Musculoskeletal: Normal range of motion. She exhibits no edema.  Right  Inspection: Normal appearance and symmetrical. No ecchymosis or effusion. Palpation: Non-tender anterior joint line, mild discomfort posteriorly ROM: Slightly limited flexion due to pain otherwise full extension Special Testing: Lachman / Valgus/Varus tests  negative with intact ligaments (ACL, MCL, LCL) Strength: 5/5 intact knee flex/ext, ankle dorsi/plantarflex Neurovascular: distally intact sensation light touch and pulses  Neurological: She is alert and oriented to person, place, and time.  Skin: Skin is warm and dry. No rash noted. She is not diaphoretic. No erythema.  Psychiatric: Her behavior is normal.  Nursing note and vitals reviewed.  I have personally reviewed the radiology report from 05/03/17 Knee X-ray Right.   CLINICAL DATA:  Knee pain  EXAM: RIGHT KNEE - COMPLETE 4+ VIEW  COMPARISON:   None.  FINDINGS: Joint spaces normal. No significant degenerative change. Negative for fracture or effusion. Calcification in the medial collateral ligament compatible with prior injury.  IMPRESSION: No acute abnormality.  Calcification medial collateral ligament.   Electronically Signed   By: Marlan Palauharles  Clark M.D.   On: 05/03/2017 13:51  Results for orders placed or performed in visit on 02/26/17  COMPLETE METABOLIC PANEL WITH GFR  Result Value Ref Range   Glucose, Bld 84 65 - 99 mg/dL   BUN 19 7 - 25 mg/dL   Creat 7.820.94 9.560.50 - 2.131.10 mg/dL   GFR, Est Non African American 71 > OR = 60 mL/min/1.7573m2   GFR, Est African American 83 > OR = 60 mL/min/1.1773m2   BUN/Creatinine Ratio NOT APPLICABLE 6 - 22 (calc)   Sodium 139 135 - 146 mmol/L   Potassium 3.9 3.5 - 5.3 mmol/L   Chloride 104 98 - 110 mmol/L   CO2 28 20 - 32 mmol/L   Calcium 9.5 8.6 - 10.2 mg/dL   Total Protein 6.9 6.1 - 8.1 g/dL   Albumin 4.4 3.6 - 5.1 g/dL   Globulin 2.5 1.9 - 3.7 g/dL (calc)   AG Ratio 1.8 1.0 - 2.5 (calc)   Total Bilirubin 0.3 0.2 - 1.2 mg/dL   Alkaline phosphatase (APISO) 53 33 - 115 U/L   AST 16 10 - 35 U/L   ALT 17 6 - 29 U/L  Hemoglobin A1c  Result Value Ref Range   Hgb A1c MFr Bld 5.3 <5.7 % of total Hgb   Mean Plasma Glucose 105 (calc)   eAG (mmol/L) 5.8 (calc)  Lipid panel  Result Value Ref Range   Cholesterol 186 <200 mg/dL   HDL 68 >08>50 mg/dL   Triglycerides 81 <657<150 mg/dL   LDL Cholesterol (Calc) 101 (H) mg/dL (calc)   Total CHOL/HDL Ratio 2.7 <5.0 (calc)   Non-HDL Cholesterol (Calc) 118 <130 mg/dL (calc)  CBC with Differential/Platelet  Result Value Ref Range   WBC 4.1 3.8 - 10.8 Thousand/uL   RBC 4.02 3.80 - 5.10 Million/uL   Hemoglobin 12.7 11.7 - 15.5 g/dL   HCT 84.638.5 96.235.0 - 95.245.0 %   MCV 95.8 80.0 - 100.0 fL   MCH 31.6 27.0 - 33.0 pg   MCHC 33.0 32.0 - 36.0 g/dL   RDW 84.112.0 32.411.0 - 40.115.0 %   Platelets 289 140 - 400 Thousand/uL   MPV 10.6 7.5 - 12.5 fL   Neutro Abs 1,800  1,500 - 7,800 cells/uL   Lymphs Abs 1,935 850 - 3,900 cells/uL   WBC mixed population 295 200 - 950 cells/uL   Eosinophils Absolute 49 15 - 500 cells/uL   Basophils Absolute 21 0 - 200 cells/uL   Neutrophils Relative % 43.9 %   Total Lymphocyte 47.2 %   Monocytes Relative 7.2 %   Eosinophils Relative 1.2 %   Basophils Relative 0.5 %      Assessment & Plan:   Problem  List Items Addressed This Visit    Acute pain of right knee - Primary    Acute on chronic R > L posterior Knee pain w/o swelling without known injury or trauma with old chronic injury - Concern OA/DJD, but prior x-rays not entirely suggestive - Able to bear weight, no knee instability or mechanical locking - No prior history of knee surgery, arthroscopy - Inadequate conservative therapy, temporarily improved NSAIDs - Last knee x-ray 05/03/2017  Plan: 1. Re order NSAID trial Ibuprofen 82540m q 8 hr PRN - hold other nsaid meloxicam - May continue topical Diclofenac if helping 2. Increase regular Tylenol dosing 3. RICE therapy (rest, ice, compression, elevation) for swelling, activity modification 4. Offered steroid injection - declined today she is traveling sooner wants to avoid injection for now 5. Follow-up if not improved 1-2 weeks steroid injection, consider referral PT or back to [facility]      Relevant Medications   ibuprofen (IBU) 800 MG tablet   Bipolar affective disorder (HCC)    Stable Followed by [facility] Controlled on Seroquel 100mg  daily      Primary osteoarthritis involving multiple joints   Relevant Medications   ibuprofen (IBU) 800 MG tablet      Meds ordered this encounter  Medications  . DISCONTD: IBU 800 MG tablet    Sig: take 1 tablet by mouth every 8 hours if needed    Refill:  0  . ibuprofen (IBU) 800 MG tablet    Sig: Take 1 tablet (800 mg total) every 8 (eight) hours as needed by mouth for headache or moderate pain.    Dispense:  60 tablet    Refill:  2    Follow up  plan: Return in about 1 week (around 05/21/2017), or if symptoms worsen or fail to improve, for R knee pain.  Saralyn PilarAlexander Karamalegos, DO [facility] 05/15/2017, 12:19 AM

## 2017-05-15 NOTE — Assessment & Plan Note (Signed)
[facility] Controlled on Seroquel 100mg  daily

## 2017-05-15 NOTE — Assessment & Plan Note (Signed)
Acute on chronic R > L posterior Knee pain w/o swelling without known injury or trauma with old chronic injury - Concern OA/DJD, but prior x-rays not entirely suggestive - Able to bear weight, no knee instability or mechanical locking - No prior history of knee surgery, arthroscopy - Inadequate conservative therapy, temporarily improved NSAIDs - Last knee x-ray 05/03/2017  Plan: 1. Re order NSAID trial Ibuprofen 83343m q 8 hr PRN - hold other nsaid meloxicam - May continue topical Diclofenac if helping 2. Increase regular Tylenol dosing 3. RICE therapy (rest, ice, compression, elevation) for swelling, activity modification 4. Offered steroid injection - declined today she is traveling sooner wants to avoid injection for now 5. Follow-up if not improved 1-2 weeks steroid injection, consider referral PT or back to [facility]

## 2017-05-21 ENCOUNTER — Encounter: Payer: Self-pay | Admitting: Family Medicine

## 2017-05-21 ENCOUNTER — Ambulatory Visit (INDEPENDENT_AMBULATORY_CARE_PROVIDER_SITE_OTHER): Payer: Medicare HMO | Admitting: Family Medicine

## 2017-05-21 VITALS — BP 133/71 | HR 63 | Temp 98.4°F | Resp 16 | Ht 67.0 in | Wt 240.0 lb

## 2017-05-21 DIAGNOSIS — M150 Primary generalized (osteo)arthritis: Secondary | ICD-10-CM

## 2017-05-21 DIAGNOSIS — G8929 Other chronic pain: Secondary | ICD-10-CM

## 2017-05-21 DIAGNOSIS — M159 Polyosteoarthritis, unspecified: Secondary | ICD-10-CM

## 2017-05-21 DIAGNOSIS — M25562 Pain in left knee: Secondary | ICD-10-CM

## 2017-05-21 DIAGNOSIS — M25561 Pain in right knee: Secondary | ICD-10-CM | POA: Diagnosis not present

## 2017-05-21 MED ORDER — METHYLPREDNISOLONE ACETATE 40 MG/ML IJ SUSP
40.0000 mg | Freq: Once | INTRAMUSCULAR | Status: AC
  Administered 2017-05-21: 40 mg via INTRA_ARTICULAR

## 2017-05-21 MED ORDER — LIDOCAINE HCL (PF) 1 % IJ SOLN
4.0000 mL | Freq: Once | INTRAMUSCULAR | Status: AC
  Administered 2017-05-21: 4 mL

## 2017-05-21 NOTE — Progress Notes (Signed)
Subjective:    Patient ID: Dawn Baldwin, female    DOB: [birth year], 51 y.o.   MRN: [id number]  Dawn Baldwin is a 51 y.o. female presenting on 05/21/2017 for Knee Pain   HPI   FOLLOW-UP Subacute on Chronic Knee Pain R>L / osteoarthritis multiple joints - Last visit with me 05/14/17, for same problem R knee pain flare with chronic knee pain history also was at [facility] on 11/9, treated with NSAIDs switched from meloxicam to Ibuprofen, by preference, RICE therapy knee sleeve, see prior notes for background information. - Interval update with X-ray without confirmed significant DJD of joint, has some calcification of MCL, has had some mild improvement with wearing knee sleeve still causing pain and somewhat limiting her. - Today patient reports ready for knee injection now returning. She is able to walk and bear weight without problem, worse if prolonged seated or driving with R leg extended for long period of time. Will aching pain all over and in back of knee at times - Admits some swelling improves with elevation and sleeve - Previously followed by [facility] for other problems not knees - Denies new injury, swelling, redness, other joint pain, fever chills  Health Maintenance: UTD Flu Shot  Social History   Tobacco Use  . Smoking status: Former Smoker    Packs/day: 1.00    Types: Cigars    Last attempt to quit: 03/26/2015    Years since quitting: 2.1  . Smokeless tobacco: Never Used  Substance Use Topics  . Alcohol use: No  . Drug use: No    Review of Systems Per HPI unless specifically indicated above     Objective:    BP 133/71   Pulse 63   Temp 98.4 F (36.9 C) (Oral)   Resp 16   Ht 5\' 7"  (1.702 m)   Wt 240 lb (108.9 kg)   BMI 37.59 kg/m   Wt Readings from Last 3 Encounters:  05/21/17 240 lb (108.9 kg)  05/14/17 241 lb 9.6 oz (109.6 kg)  05/03/17 234 lb (106.1 kg)    Physical Exam  Constitutional: She is oriented to person, place, and time. She appears  well-developed and well-nourished. No distress.  Well-appearing, comfortable cooperative  HENT:  Head: Normocephalic and atraumatic.  Mouth/Throat: Oropharynx is clear and moist.  Eyes: Conjunctivae are normal. Right eye exhibits no discharge. Left eye exhibits no discharge.  Neck: Normal range of motion.  Cardiovascular: Normal rate and intact distal pulses.  No murmur heard. Pulmonary/Chest: Effort normal.  Musculoskeletal: Normal range of motion.  Right Knee Inspection: Appears slightly bulky today with some surrounding soft tissue edema, some R lateral knee varicose veins mostly symmtrical. No ecchymosis Palpation: Non-tender anterior joint line, mild discomfort posteriorly still ROM: Mostly full active ROM flex / extend, some discomfort with full extension Special Testing: Lachman / Valgus/Varus tests negative with intact ligaments (ACL, MCL, LCL) Strength: 5/5 intact knee flex/ext, ankle dorsi/plantarflex Neurovascular: distally intact sensation light touch and pulses  Has knee sleeve, removed for exam  Neurological: She is alert and oriented to person, place, and time.  Skin: Skin is warm and dry. No rash noted. She is not diaphoretic. No erythema.  Psychiatric: Her behavior is normal.  Nursing note and vitals reviewed.   ________________________________________________________ PROCEDURE NOTE Date: 05/21/17 Right Knee Steroid injection Discussed benefits and risks (including pain, bleeding, infection, steroid flare). Verbal consent given by patient. Medication:  1 cc Depo-medrol 40mg  and 4 cc Lidocaine 1% without epi Time Out  taken  Landmarks identified. Area cleansed with alcohol wipes.Using 21 gauge and 1, 1/2 inch needle, Right knee joint space was injected (with above listed medication) via lateral approach cold spray used for superficial anesthetic.Sterile bandage placed.Patient tolerated procedure well without bleeding or paresthesias.No  complications.  ------------------------------------------------------------ I have personally reviewed the radiology report from 05/03/17 R knee x-ray.  CLINICAL DATA: Knee pain  EXAM: RIGHT KNEE - COMPLETE 4+ VIEW  COMPARISON: None.  FINDINGS: Joint spaces normal. No significant degenerative change. Negative for fracture or effusion. Calcification in the medial collateral ligament compatible with prior injury.  IMPRESSION: No acute abnormality. Calcification medial collateral ligament.   Electronically Signed By: Marlan Palauharles Clark M.D. On: 05/03/2017 13:51  Results for orders placed or performed in visit on 02/26/17  COMPLETE METABOLIC PANEL WITH GFR  Result Value Ref Range   Glucose, Bld 84 65 - 99 mg/dL   BUN 19 7 - 25 mg/dL   Creat 9.600.94 4.540.50 - 0.981.10 mg/dL   GFR, Est Non African American 71 > OR = 60 mL/min/1.5073m2   GFR, Est African American 83 > OR = 60 mL/min/1.6173m2   BUN/Creatinine Ratio NOT APPLICABLE 6 - 22 (calc)   Sodium 139 135 - 146 mmol/L   Potassium 3.9 3.5 - 5.3 mmol/L   Chloride 104 98 - 110 mmol/L   CO2 28 20 - 32 mmol/L   Calcium 9.5 8.6 - 10.2 mg/dL   Total Protein 6.9 6.1 - 8.1 g/dL   Albumin 4.4 3.6 - 5.1 g/dL   Globulin 2.5 1.9 - 3.7 g/dL (calc)   AG Ratio 1.8 1.0 - 2.5 (calc)   Total Bilirubin 0.3 0.2 - 1.2 mg/dL   Alkaline phosphatase (APISO) 53 33 - 115 U/L   AST 16 10 - 35 U/L   ALT 17 6 - 29 U/L  Hemoglobin A1c  Result Value Ref Range   Hgb A1c MFr Bld 5.3 <5.7 % of total Hgb   Mean Plasma Glucose 105 (calc)   eAG (mmol/L) 5.8 (calc)  Lipid panel  Result Value Ref Range   Cholesterol 186 <200 mg/dL   HDL 68 >11>50 mg/dL   Triglycerides 81 <914<150 mg/dL   LDL Cholesterol (Calc) 101 (H) mg/dL (calc)   Total CHOL/HDL Ratio 2.7 <5.0 (calc)   Non-HDL Cholesterol (Calc) 118 <130 mg/dL (calc)  CBC with Differential/Platelet  Result Value Ref Range   WBC 4.1 3.8 - 10.8 Thousand/uL   RBC 4.02 3.80 - 5.10 Million/uL   Hemoglobin 12.7  11.7 - 15.5 g/dL   HCT 78.238.5 95.635.0 - 21.345.0 %   MCV 95.8 80.0 - 100.0 fL   MCH 31.6 27.0 - 33.0 pg   MCHC 33.0 32.0 - 36.0 g/dL   RDW 08.612.0 57.811.0 - 46.915.0 %   Platelets 289 140 - 400 Thousand/uL   MPV 10.6 7.5 - 12.5 fL   Neutro Abs 1,800 1,500 - 7,800 cells/uL   Lymphs Abs 1,935 850 - 3,900 cells/uL   WBC mixed population 295 200 - 950 cells/uL   Eosinophils Absolute 49 15 - 500 cells/uL   Basophils Absolute 21 0 - 200 cells/uL   Neutrophils Relative % 43.9 %   Total Lymphocyte 47.2 %   Monocytes Relative 7.2 %   Eosinophils Relative 1.2 %   Basophils Relative 0.5 %      Assessment & Plan:   Problem List Items Addressed This Visit    Acute pain of right knee - Primary    Subacute on chronic R > L general  and posterior Knee pain now with some slight edema and varicosity swelling without known injury or trauma with old chronic injury - Concern OA/DJD, but prior x-rays not entirely suggestive. Some MCL calcification, may have old chronic ligament or tendon injury. - Able to bear weight, no knee instability or mechanical locking - No prior history of knee surgery, arthroscopy - Improved on NSAIDs temporarily - Last knee x-ray 05/03/2017  Plan: 1. Steroid injection R knee today - tolerated well, see procedure note - Continue Ibuprofen PRN 2. Increase regular Tylenol dosing 3. RICE therapy (rest, ice, compression, elevation) for swelling, activity modification 4. Follow-up if not improved 4 weeks after steroid injection, consider referral PT or back to [facility] - she may contact us sooner for PT if needed, likely she prefers to return to [facility] for PT      Relevant Medications   methylPREDNISolone acetate (DEPO-MEDROL) injection 40 mg (Completed)   lidocaine (PF) (XYLOCAINE) 1 % injection 4 mL (Completed)   Chronic pain of both knees   Relevant Medications   methylPREDNISolone acetate (DEPO-MEDROL) injection 40 mg (Completed)   lidocaine (PF) (XYLOCAINE) 1 % injection 4 mL  (Completed)   Primary osteoarthritis involving multiple joints   Relevant Medications   methylPREDNISolone acetate (DEPO-MEDROL) injection 40 mg (Completed)      Meds ordered this encounter  Medications  . methylPREDNISolone acetate (DEPO-MEDROL) injection 40 mg  . lidocaine (PF) (XYLOCAINE) 1 % injection 4 mL    Follow up plan: Return in about 4 weeks (around 06/18/2017), or if symptoms worsen or fail to improve, for knee pain.  Saralyn PilarAlexander Clarke Amburn, DO [facility] 05/21/2017, 8:47 AM

## 2017-05-21 NOTE — Patient Instructions (Addendum)
Thank you for coming to the clinic today.  1.  You received a  Right Knee Joint steroid injection today. - Lidocaine numbing medicine may ease the pain initially for a few hours until it wears off - As discussed, you may experience a "steroid flare" this evening or within 24-48 hours, anytime medicine is injected into an inflamed joint it can cause the pain to get worse temporarily - Everyone responds differently to these injections, it depends on the patient and the severity of the joint problem, it may provide anywhere from days to weeks, to months of relief. Ideal response is >6 months relief - Try to take it easy for next 1-2 days, avoid over activity and strain on joint (limit walking for knee) - Recommend the following:   - For swelling - rest, compression sleeve / ACE wrap, elevation, and ice packs as needed for first few days   - For pain in future may use heating pad or moist heat as needed  Medication - Continue ibuprofen as needed  If not improved in 2-3 weeks, call me and we can refer you to Physical Therapy  PHYSICAL THERAPY (PT)  [facility] for PT  Please schedule a Follow-up Appointment to: Return in about 4 weeks (around 06/18/2017), or if symptoms worsen or fail to improve, for knee pain.  If you have any other questions or concerns, please feel free to call the clinic or send a message through MyChart. You may also schedule an earlier appointment if necessary.  Additionally, you may be receiving a survey about your experience at our clinic within a few days to 1 week by e-mail or mail. We value your feedback.  Saralyn PilarAlexander Karamalegos, DO [facility]

## 2017-05-21 NOTE — Assessment & Plan Note (Signed)
Subacute on chronic R > L general and posterior Knee pain now with some slight edema and varicosity swelling without known injury or trauma with old chronic injury - Concern OA/DJD, but prior x-rays not entirely suggestive. Some MCL calcification, may have old chronic ligament or tendon injury. - Able to bear weight, no knee instability or mechanical locking - No prior history of knee surgery, arthroscopy - Improved on NSAIDs temporarily - Last knee x-ray 05/03/2017  Plan: 1. Steroid injection R knee today - tolerated well, see procedure note - Continue Ibuprofen PRN 2. Increase regular Tylenol dosing 3. RICE therapy (rest, ice, compression, elevation) for swelling, activity modification 4. Follow-up if not improved 4 weeks after steroid injection, consider referral PT or back to [facility] - she may contact us sooner for PT if needed, likely she prefers to return to [facility] for PT

## 2017-05-31 ENCOUNTER — Telehealth: Payer: Self-pay | Admitting: Family Medicine

## 2017-05-31 DIAGNOSIS — M25562 Pain in left knee: Secondary | ICD-10-CM

## 2017-05-31 DIAGNOSIS — G8929 Other chronic pain: Secondary | ICD-10-CM

## 2017-05-31 DIAGNOSIS — M25561 Pain in right knee: Secondary | ICD-10-CM

## 2017-05-31 NOTE — Telephone Encounter (Signed)
Pt called states that the shot  Was not  Working.  Pt  Call back # is [phone]

## 2017-05-31 NOTE — Telephone Encounter (Signed)
Last seen 11/27, for R knee steroid injection. Since not improving and failed medical therapy as well, limited options, I have offered physical therapy and orthopedics in past.  I would recommend starting with Physical Therapy, [facility].  If still not improving we can refer her back to [facility] (she has seen them for other complaint, trigger finger)  Called patient, notified, seems steroid injection helped for 1-2 days, then limited relief. Agrees with referral to PT, she declines ortho referral at this time, but understands may be necessary if still not improving.  Saralyn PilarAlexander Nyra Anspaugh, DO [facility] 05/31/2017, 5:10 PM

## 2017-06-07 ENCOUNTER — Telehealth: Payer: Self-pay | Admitting: Family Medicine

## 2017-06-07 DIAGNOSIS — M25562 Pain in left knee: Secondary | ICD-10-CM

## 2017-06-07 DIAGNOSIS — G8929 Other chronic pain: Secondary | ICD-10-CM

## 2017-06-07 DIAGNOSIS — M25561 Pain in right knee: Secondary | ICD-10-CM

## 2017-06-07 NOTE — Telephone Encounter (Signed)
Pt. Called  requesting pt. That you call her, she was wanting to know if she could go to see Rheumatology.

## 2017-06-07 NOTE — Telephone Encounter (Signed)
Called patient earlier this AM approx 930 discussed her concern regarding her Right knee pain, still not improved see recent office visit notes with treatments including NSAIDs, topical NSAID diclofenac, X-rays, steroid injection, she was most recently advised to arrange Physical Therapy but she has declined proceeding with this and now wants to have 2nd opinion from Rheumatology. I discussed Rheum vs ortho, and would like to start with Orthopedics first, she may return to [facility] who she has already seen before (Dr Mathis BudHernandez Soria) for trigger finger, so she may establish with new provider for this new problem for 2nd opinion, eval and management.  Dawn PilarAlexander Kymberly Blomberg, DO [facility] 06/07/2017, 12:01 PM

## 2017-06-10 DIAGNOSIS — M25561 Pain in right knee: Secondary | ICD-10-CM | POA: Diagnosis not present

## 2017-06-24 DIAGNOSIS — G8929 Other chronic pain: Secondary | ICD-10-CM | POA: Diagnosis not present

## 2017-06-24 DIAGNOSIS — I10 Essential (primary) hypertension: Secondary | ICD-10-CM | POA: Diagnosis not present

## 2017-06-24 DIAGNOSIS — M25561 Pain in right knee: Secondary | ICD-10-CM | POA: Diagnosis not present

## 2017-07-02 ENCOUNTER — Ambulatory Visit: Payer: Medicare HMO | Attending: Family Medicine

## 2017-07-04 ENCOUNTER — Ambulatory Visit: Payer: Medicare HMO

## 2017-07-08 DIAGNOSIS — M25561 Pain in right knee: Secondary | ICD-10-CM | POA: Diagnosis not present

## 2017-07-11 ENCOUNTER — Other Ambulatory Visit: Payer: Self-pay | Admitting: Orthopedic Surgery

## 2017-07-11 DIAGNOSIS — M25561 Pain in right knee: Secondary | ICD-10-CM

## 2017-07-17 ENCOUNTER — Ambulatory Visit
Admission: RE | Admit: 2017-07-17 | Discharge: 2017-07-17 | Disposition: A | Discharge status: Home / Self Care | Payer: PRIVATE HEALTH INSURANCE | Source: Ambulatory Visit | Attending: Orthopedic Surgery | Admitting: Orthopedic Surgery

## 2017-07-17 DIAGNOSIS — M25561 Pain in right knee: Secondary | ICD-10-CM

## 2017-07-17 DIAGNOSIS — Z79891 Long term (current) use of opiate analgesic: Secondary | ICD-10-CM | POA: Diagnosis not present

## 2017-07-17 DIAGNOSIS — M67461 Ganglion, right knee: Secondary | ICD-10-CM | POA: Diagnosis not present

## 2017-07-17 DIAGNOSIS — S83206A Unspecified tear of unspecified meniscus, current injury, right knee, initial encounter: Secondary | ICD-10-CM | POA: Diagnosis not present

## 2017-07-17 DIAGNOSIS — X58XXXA Exposure to other specified factors, initial encounter: Secondary | ICD-10-CM | POA: Insufficient documentation

## 2017-07-17 DIAGNOSIS — M7121 Synovial cyst of popliteal space [Baker], right knee: Secondary | ICD-10-CM | POA: Insufficient documentation

## 2017-07-17 DIAGNOSIS — S83281A Other tear of lateral meniscus, current injury, right knee, initial encounter: Secondary | ICD-10-CM | POA: Diagnosis not present

## 2017-07-17 DIAGNOSIS — G8929 Other chronic pain: Secondary | ICD-10-CM | POA: Diagnosis not present

## 2017-07-24 DIAGNOSIS — G894 Chronic pain syndrome: Secondary | ICD-10-CM | POA: Diagnosis not present

## 2017-07-24 DIAGNOSIS — S83206A Unspecified tear of unspecified meniscus, current injury, right knee, initial encounter: Secondary | ICD-10-CM | POA: Diagnosis not present

## 2017-07-24 DIAGNOSIS — M25561 Pain in right knee: Secondary | ICD-10-CM | POA: Diagnosis not present

## 2017-07-25 DIAGNOSIS — M25561 Pain in right knee: Secondary | ICD-10-CM | POA: Diagnosis not present

## 2017-07-31 ENCOUNTER — Ambulatory Visit: Payer: PRIVATE HEALTH INSURANCE | Attending: Orthopedic Surgery | Admitting: Physical Therapy

## 2017-07-31 ENCOUNTER — Other Ambulatory Visit: Payer: Self-pay

## 2017-07-31 ENCOUNTER — Encounter: Payer: Self-pay | Admitting: Physical Therapy

## 2017-07-31 DIAGNOSIS — G8929 Other chronic pain: Secondary | ICD-10-CM

## 2017-07-31 DIAGNOSIS — M25561 Pain in right knee: Secondary | ICD-10-CM | POA: Insufficient documentation

## 2017-07-31 DIAGNOSIS — M6281 Muscle weakness (generalized): Secondary | ICD-10-CM | POA: Diagnosis not present

## 2017-07-31 NOTE — Therapy (Addendum)
[facility] MAIN [facility] SERVICES [address] [place], [place], [address] Phone: [phone]   Fax:  [phone]  Physical Therapy Evaluation  Patient Details  Name: Dawn Baldwin MRN: [id number] Date of Birth: 11/21/1965 Referring Provider: Juanell Fairly, MD   Encounter Date: 07/31/2017  PT End of Session - 07/31/17 1746    Visit Number  1    Number of Visits  9    Date for PT Re-Evaluation  08/28/17    PT Start Time  1645    PT Stop Time  1745    PT Time Calculation (min)  60 min    Activity Tolerance  Patient tolerated treatment well;No increased pain    Behavior During Therapy  WFL for tasks assessed/performed       Past Medical History:  Diagnosis Date  . Allergy   . Anemia   . Bipolar affective disorder (HCC)   . Depression   . GERD (gastroesophageal reflux disease)   . Hypertension     Past Surgical History:  Procedure Laterality Date  . TRIGGER FINGER RELEASE  11/23/2016    There were no vitals filed for this visit.   Subjective Assessment - 07/31/17 1649    Subjective  Pleasant 52 yo female presents to physical therapy with chronic R knee pain.    Pertinent History  Onset of knee pain occurred three months ago when R knee "locked up" in church. Patient mentions that she used to do a lot of dance on her knees and "may be paying for it now". Patient has pain with walking her dogs, negotiating stairs, getting into and out of car. The knee also bothers her with prolonged extension (such as during driving). 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 Knee pain has proven intractable to two cortisone shots; patient considering surgery but wants to try physical therapy first.    Limitations  Walking    How long can you sit comfortably?  n/a    How long can you stand comfortably?  n/a    How long can you walk comfortably?  Cannot walk comfortably    Diagnostic tests  MRI shows meniscus tear     Patient Stated Goals  Improve knee, exercise, avoid surgery    Currently in Pain?  Yes    Pain Score  6     Pain Location  Knee    Pain Orientation  Right    Pain Type  Chronic pain    Pain Onset  More than a month ago    Pain Frequency  Constant    Aggravating Factors   Lifting leg, bending leg    Pain Relieving Factors  Medication, brace, compression    Effect of Pain on Daily Activities  Limits ADLs      PAIN 8/10 current 8/10 worst 0/10 best Knee pain comes on sharply with movement into restricted range. Persists for a short time after.    POSTURE Mild-moderate increased lumbar lordosis  GAIT  B Trendelenberg, B knee valgus, B knee hyperextension in stance phase. Mild reduced step length.  MOBILITY / TRANSFERS Independent   PROM / AROM / MMT  Knee AROM  PROM    R L R L  Flex 80 WNL 85 WNL  Ext. -25 WNL 0 WNL  AROM assessed sitting PROM assessed supine   HIP MMT    R L  Flex 4- 4-  Ext. 3- 3-  ABD 3 3  ADD *  2+  G. max 3- 3-  * not tested due to pain   Ankle grossly WNL   SOFT TISSUE MCL, LCL, ACL, PCL intact.  PALPATION / PAM Mild edema noted in posterior knee. Patellar mobility WNL  NEURO Sensation: light touch apparently intact Coordination: gross coordination intact   SPECIAL TESTS + McMurray's R + Steinmann Sign 1 R + Joint-line tenderness   OUTCOME MEASURES  TEST SCORE INTERPRETATION  10MW 1.28 m/s WNL for age/gender; minimum fall risk  LEFS -- Patient did not finish    TREATMENT  Supine B hip abduction with yellow theraband x 15. Cues for correct technique. Side-lying R/L clams with yellow theraband x 10. Cues for hip alignment. Added to HEP.  Pt instructed in appropriate activity limitations. Advised to wear brace, RICE.    PT Short Term Goals - 07/31/17 1751      PT SHORT TERM GOAL #1   Title  Patient will be adherent to home exercise program at least 3x/week for improved hip strengthening.    Baseline  HEP given     Time  2    Period  Weeks    Status  New    Target Date  08/14/17      PT SHORT TERM GOAL #2   Title  Patient will increase knee AROM to 0 deg. ext and 100 deg. flexion for improved functional mobility.    Baseline  Ext -25, flex 80    Time  2    Period  Weeks    Status  New    Target Date  08/14/17      PT SHORT TERM GOAL #3   Title  Patient will decrease R knee pain to 3/10 to demonstrate improved functional capacity with ADLs.    Baseline  8/10    Time  2    Period  Weeks    Status  New    Target Date  08/14/17       PT Long Term Goals - 07/31/17 1843      PT LONG TERM GOAL #1   Title  Patient will show normal hip stability with gait to demonstrate reduced biomechanical load on lateral knee.    Baseline  Bilateral Trendelenberg apparent with gait    Time  4    Period  Weeks    Status  New    Target Date  08/28/17      PT LONG TERM GOAL #2   Title  Patient will increase BLE hip extension and abduction to 4- or higher to demonstrate improved functional capacity with gait and ADLs.    Baseline  Hip ext. 3-, abd 3- BLE    Time  4    Period  Weeks    Status  New    Target Date  08/28/17      PT LONG TERM GOAL #3   Title  Patient will increase R knee AROM to 120 deg. flexion to demonstrate full functional ROM.    Baseline  R knee AROM flexion 85    Time  4    Period  Weeks    Status  New    Target Date  08/28/17      PT LONG TERM GOAL #4   Title  Patient will demonstrate ability to get into and out of her vehicle without modification and with no increase in R knee pain to show improved functional mobility.    Baseline  Patient must enter vehicle by walking backwards to  avoid knee pain.    Time  4    Period  Weeks    Status  New    Target Date  08/28/17      Plan - 07/31/17 1746    Clinical Impression Statement  Pleasant 52 yo presents to physical therapy with chronic R knee pain. MRI in Jan showed lateral meniscus tear, mild medial meniscus degeneration,  Baker's cyst, and ganglion cyst on the intercondylar notch of the R knee. Patient cannot actively extend knee to neutral in sitting and is limited to 80 deg. active flexion. Posterior knee shows mild edema. Hips are substantially weak bilaterally. Special tests consistent with lateral meniscus involvement. Patient demonstrates bilateral Trendelenberg, knee valgus, and knee hyperextension in stance phase of gait, suggesting increased load through the lateral knee due to hip weakness and poor gait biomechanics. Patient would benefit from skilled physical therapy to improve knee ROM, reduce pain, and improve hip strength for return to PLOF.     History and Personal Factors relevant to plan of care:  Obesity, meniscus tear on MRI    Clinical Presentation  Stable    Clinical Presentation due to:  Single-joint involvement; consistent pain    Clinical Decision Making  Low    Rehab Potential  Good    Clinical Impairments Affecting Rehab Potential  - Obesity, chronicity, severity / + Motivated    PT Frequency  2x / week    PT Duration  4 weeks    PT Treatment/Interventions  Moist Heat;Ultrasound;Gait training;Stair training;Functional mobility training;Therapeutic activities;Therapeutic exercise;Patient/family education;Neuromuscular re-education;Manual techniques;Compression bandaging;Passive range of motion;Dry needling;Energy conservation;Taping;Cryotherapy;Aquatic Therapy    PT Next Visit Plan  Hip strengthening    PT Home Exercise Plan  Side-lying clams    Consulted and Agree with Plan of Care  Patient       Patient will benefit from skilled therapeutic intervention in order to improve the following deficits and impairments:  Abnormal gait, Decreased mobility, Decreased range of motion, Decreased strength, Decreased endurance, Decreased activity tolerance, Improper body mechanics, Pain, Increased edema, Difficulty walking, Obesity, Impaired flexibility  Visit Diagnosis: Chronic pain of right  knee  Muscle weakness (generalized)  .    Problem List Patient Active Problem List   Diagnosis Date Noted  . Acute pain of right knee 05/03/2017  . Chronic pain of both knees 02/19/2017  . Primary osteoarthritis involving multiple joints 02/19/2017  . Bilateral lower extremity edema 02/19/2017  . Chronic venous insufficiency 02/19/2017  . Chronic low back pain 12/21/2016  . LAD (lymphadenopathy), posterior cervical 10/24/2016  . Abnormal glucose 07/09/2016  . Polyuria 07/09/2016  . Trigger thumb of right hand 05/22/2016  . Hot flushes, perimenopausal 06/09/2015  . Hypertension 06/03/2015  . Bipolar affective disorder (HCC) 03/10/2015  . Allergic rhinitis 03/10/2015  . Smoker 03/10/2015  . Obesity (BMI 30-39.9) 03/10/2015   Janee Morn, SPT 08/01/2017, 11:37 AM  [facility] MAIN [facility] SERVICES [address] [place], [place], [address] Phone: [phone]   Fax:  [phone]  Name: Dawn Baldwin MRN: [id number] Date of Birth: May 21, 1966

## 2017-07-31 NOTE — Patient Instructions (Signed)
Clams - Side-lying    Tie yellow theraband around your knees.  Lay on your side. Bend your knees as shown in the picture.  Keep your feet together. Keep your hips stacked and pointed at the ceiling.  SLOWLY open your knees like a book and SLOWLY bring them back together.  10 reps per set, 2 sets per day.   http://ecce.exer.us/65   Copyright  VHI. All rights reserved.

## 2017-08-01 NOTE — Addendum Note (Signed)
Addended by: Ezekiel InaMANSFIELD, Ivannah Zody S on: 08/01/2017 12:46 PM   Modules accepted: Orders

## 2017-08-05 ENCOUNTER — Ambulatory Visit: Payer: PRIVATE HEALTH INSURANCE | Admitting: Physical Therapy

## 2017-08-07 ENCOUNTER — Encounter: Payer: Self-pay | Admitting: Physical Therapy

## 2017-08-07 ENCOUNTER — Ambulatory Visit: Payer: PRIVATE HEALTH INSURANCE | Admitting: Physical Therapy

## 2017-08-07 DIAGNOSIS — G8929 Other chronic pain: Secondary | ICD-10-CM

## 2017-08-07 DIAGNOSIS — M6281 Muscle weakness (generalized): Secondary | ICD-10-CM

## 2017-08-07 DIAGNOSIS — M25561 Pain in right knee: Secondary | ICD-10-CM | POA: Diagnosis not present

## 2017-08-07 NOTE — Therapy (Signed)
[facility] MAIN [facility] SERVICES [address] [place], [place], [address] Phone: [phone]   Fax:  [phone]  Physical Therapy Treatment  Patient Details  Name: Dawn Baldwin MRN: [id number] Date of Birth: 1965-07-20 Referring Provider: Juanell Fairly, MD   Encounter Date: 08/07/2017  PT End of Session - 08/07/17 1638    Visit Number  2    Number of Visits  9    Date for PT Re-Evaluation  08/28/17    PT Start Time  1630    PT Stop Time  1710    PT Time Calculation (min)  40 min    Activity Tolerance  Patient tolerated treatment well;No increased pain;Patient limited by pain    Behavior During Therapy  [facility] for tasks assessed/performed       Past Medical History:  Diagnosis Date  . Allergy   . Anemia   . Bipolar affective disorder (HCC)   . Depression   . GERD (gastroesophageal reflux disease)   . Hypertension     Past Surgical History:  Procedure Laterality Date  . TRIGGER FINGER RELEASE  11/23/2016    There were no vitals filed for this visit.  Subjective Assessment - 08/07/17 1636    Subjective  Pleasant 52 yo female presents to physical therapy with chronic R knee pain 5/10.    Pertinent History  Onset of knee pain occurred three months ago when R knee "locked up" in church. Patient mentions that she used to do a lot of dance on her knees and "may be paying for it now". Patient has pain with walking her dogs, negotiating stairs, getting into and out of car. The knee also bothers her with prolonged extension (such as during driving). 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 Knee pain has proven intractable to two cortisone shots; patient considering surgery but wants to try physical therapy first.    Limitations  Walking    How long can you sit comfortably?  n/a    How long can you stand comfortably?  n/a    How long can you walk comfortably?  Cannot walk comfortably    Diagnostic  tests  MRI shows meniscus tear    Patient Stated Goals  Improve knee, exercise, avoid surgery    Currently in Pain?  Yes    Pain Score  5     Pain Location  Knee    Pain Orientation  Right    Pain Descriptors / Indicators  Aching    Pain Type  Chronic pain    Pain Onset  More than a month ago    Pain Frequency  Constant    Aggravating Factors   lifting leg , bending knee    Pain Relieving Factors  medication, brace, compression    Effect of Pain on Daily Activities  limits ADLs      Treatment: Nu-step warm up x 5 mins RLE exercises: SAQ 5 sec hold x 20  LAQ, x 15 x 2 # Hip abd x 10 in sidelying Hip add x 10 in sidelying SLR intervals x 10, 30 deg hold , 60 deg hold Leg press 100 lbs x 20 x 2,  Toe push leg press 100 lbs x 20 x 2 BOSU ball lunge x 10 RLE  cues for correct posture and technique Pain is 3/5 right knee following treatment  PT Education - 08/07/17 1638    Education provided  Yes    Education Details  HEP    Person(s) Educated  Patient    Methods  Explanation;Demonstration;Verbal cues    Comprehension  Verbalized understanding;Returned demonstration       PT Short Term Goals - 07/31/17 1751      PT SHORT TERM GOAL #1   Title  Patient will be adherent to home exercise program at least 3x/week for improved hip strengthening.    Baseline  HEP given    Time  2    Period  Weeks    Status  New    Target Date  08/14/17      PT SHORT TERM GOAL #2   Title  Patient will increase knee AROM to 0 deg. ext and 100 deg. flexion for improved functional mobility.    Baseline  Ext -25, flex 80    Time  2    Period  Weeks    Status  New    Target Date  08/14/17      PT SHORT TERM GOAL #3   Title  Patient will decrease R knee pain to 3/10 to demonstrate improved functional capacity with ADLs.    Baseline  8/10    Time  2    Period  Weeks    Status  New    Target Date  08/14/17        PT Long Term Goals - 07/31/17 1843       PT LONG TERM GOAL #1   Title  Patient will show normal hip stability with gait to demonstrate reduced biomechanical load on lateral knee.    Baseline  Bilateral Trendelenberg apparent with gait    Time  4    Period  Weeks    Status  New    Target Date  08/28/17      PT LONG TERM GOAL #2   Title  Patient will increase BLE hip extension and abduction to 4- or higher to demonstrate improved functional capacity with gait and ADLs.    Baseline  Hip ext. 3-, abd 3- BLE    Time  4    Period  Weeks    Status  New    Target Date  08/28/17      PT LONG TERM GOAL #3   Title  Patient will increase R knee AROM to 120 deg. flexion to demonstrate full functional ROM.    Baseline  R knee AROM flexion 85    Time  4    Period  Weeks    Status  New    Target Date  08/28/17      PT LONG TERM GOAL #4   Title  Patient will demonstrate ability to get into and out of her vehicle without modification and with no increase in R knee pain to show improved functional mobility.    Baseline  Patient must enter vehicle by walking backwards to avoid knee pain.    Time  4    Period  Weeks    Status  New    Target Date  08/28/17            Plan - 08/07/17 1639    Clinical Impression Statement  Patient performs LE exericses to improve mobility and strength and flexibility in right knee. She performs exercises without increasing her pain. She will contintue to  benefit from skilled PT to improve her mobility and reach her goals of decreasing her  pain,     Rehab Potential  Good    Clinical Impairments Affecting Rehab Potential  - Obesity, chronicity, severity / + Motivated    PT Frequency  2x / week    PT Duration  4 weeks    PT Treatment/Interventions  Moist Heat;Ultrasound;Gait training;Stair training;Functional mobility training;Therapeutic activities;Therapeutic exercise;Patient/family education;Neuromuscular re-education;Manual techniques;Compression bandaging;Passive range of motion;Dry  needling;Energy conservation;Taping;Cryotherapy;Aquatic Therapy    PT Next Visit Plan  Hip strengthening    PT Home Exercise Plan  Side-lying clams    Consulted and Agree with Plan of Care  Patient       Patient will benefit from skilled therapeutic intervention in order to improve the following deficits and impairments:  Abnormal gait, Decreased mobility, Decreased range of motion, Decreased strength, Decreased endurance, Decreased activity tolerance, Improper body mechanics, Pain, Increased edema, Difficulty walking, Obesity, Impaired flexibility  Visit Diagnosis: Chronic pain of right knee  Muscle weakness (generalized)     Problem List Patient Active Problem List   Diagnosis Date Noted  . Acute pain of right knee 05/03/2017  . Chronic pain of both knees 02/19/2017  . Primary osteoarthritis involving multiple joints 02/19/2017  . Bilateral lower extremity edema 02/19/2017  . Chronic venous insufficiency 02/19/2017  . Chronic low back pain 12/21/2016  . LAD (lymphadenopathy), posterior cervical 10/24/2016  . Abnormal glucose 07/09/2016  . Polyuria 07/09/2016  . Trigger thumb of right hand 05/22/2016  . Hot flushes, perimenopausal 06/09/2015  . Hypertension 06/03/2015  . Bipolar affective disorder (HCC) 03/10/2015  . Allergic rhinitis 03/10/2015  . Smoker 03/10/2015  . Obesity (BMI 30-39.9) 03/10/2015    Ezekiel Ina, PT DPT 08/07/2017, 4:41 PM  [facility] MAIN [facility] SERVICES [address] [place], [place], [address] Phone: [phone]   Fax:  [phone]  Name: Roshell Brigham MRN: [id number] Date of Birth: May 01, 1966

## 2017-08-14 ENCOUNTER — Ambulatory Visit: Payer: PRIVATE HEALTH INSURANCE | Admitting: Physical Therapy

## 2017-08-14 DIAGNOSIS — M6281 Muscle weakness (generalized): Secondary | ICD-10-CM | POA: Diagnosis not present

## 2017-08-14 DIAGNOSIS — M25561 Pain in right knee: Secondary | ICD-10-CM | POA: Diagnosis not present

## 2017-08-14 DIAGNOSIS — G8929 Other chronic pain: Secondary | ICD-10-CM | POA: Diagnosis not present

## 2017-08-14 NOTE — Therapy (Addendum)
[facility] MAIN [facility] SERVICES [address] [place], [place], [address] Phone: [phone]   Fax:  [phone]  Physical Therapy Treatment  Patient Details  Name: Dawn Baldwin MRN: [id number] Date of Birth: 08/13/1965 Referring Provider: Juanell Fairly, MD   Encounter Date: 08/14/2017  PT End of Session - 08/14/17 1642    Visit Number  3    Number of Visits  9    Date for PT Re-Evaluation  08/28/17    PT Start Time  1548    PT Stop Time  1632    PT Time Calculation (min)  44 min    Activity Tolerance  Patient tolerated treatment well;Patient limited by pain    Behavior During Therapy  [facility] for tasks assessed/performed       Past Medical History:  Diagnosis Date  . Allergy   . Anemia   . Bipolar affective disorder (HCC)   . Depression   . GERD (gastroesophageal reflux disease)   . Hypertension     Past Surgical History:  Procedure Laterality Date  . TRIGGER FINGER RELEASE  11/23/2016    There were no vitals filed for this visit.  Subjective Assessment - 08/14/17 1559    Subjective  Reports that knee felt better after driving with cruise control, not pressing the gas pedal constantly. Sleeping better, has felt some reduced pain. Patient reports intermittent compliance with HEP.    Pertinent History  Onset of knee pain occurred three months ago when R knee "locked up" in church. Patient mentions that she used to do a lot of dance on her knees and "may be paying for it now". Patient has pain with walking her dogs, negotiating stairs, getting into and out of car. The knee also bothers her with prolonged extension (such as during driving). 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 Knee pain has proven intractable to two cortisone shots; patient considering surgery but wants to try physical therapy first.    Limitations  Walking    How long can you sit comfortably?  n/a    How long can you stand  comfortably?  n/a    How long can you walk comfortably?  Cannot walk comfortably    Diagnostic tests  MRI shows meniscus tear    Patient Stated Goals  Improve knee, exercise, avoid surgery    Currently in Pain?  Yes    Pain Score  4     Pain Location  Knee    Pain Orientation  Right    Pain Descriptors / Indicators  Aching    Pain Type  Chronic pain    Pain Onset  More than a month ago    Pain Frequency  Constant    Aggravating Factors   lifting leg, bending knee    Pain Relieving Factors  medication, brace, compression    Effect of Pain on Daily Activities  limits ADLs    Multiple Pain Sites  No       TREATMENT  Warm-up: Nu-step x 5 mins  Side-lying: Clams BLE x 10; mod-max cues for correct alignment (maintaining hips in neutral) and to slow concentric and eccentric phases of exercise for improved strengthening Abduction SLR LLE x 10; mod cues for alignment, performing exercise in limited ROM to avoid compensations; patient reports sharp pain on medial thigh when performing on RLE, saying that it "feels tight". Adduction SLR BLE x 10; min cues for correct exercise technique  Supine: Henreitta Leber  2 x 10; mod-max cues to push through heels for improved gluteal activation and to slow concentric and eccentric phases of exercise for improved strengthening SAQ towel squeeze 2 x 10; patient reports that exercise "feels good".  Standing Step-ups x 10 with BLE leading with mirror to provide visual feedback; mod cues to avoid valgus collapse through BLE knees; patient demonstrated contralateral lean (reverse Trendelenberg) when correcting valgus Side-stepping step-ups x 10 RLE leading with mirror to provide visual feedback; min cues for upright posture to improve gluteal activation and minimize TFL substitution  Seated: LAQ no resistance x 10 Adduction ball squeezes with three second hold x 10; mod cues required for squeezing ball, therapist stabilization required    Patient voiced  concerns regarding possibility of surgery; PT advised that physical therapy may be able to delay surgery  If therapy is  pursued assiduously.  Patient voiced several questions concerning exercise limitations; advised to restrict LE exercise until she is able to correct knee valgus to prevent worsening of symptoms.    PT Education - 08/14/17 1642    Education provided  Yes    Education Details  HEP compliance; posture and knee alignment; LE strengthening; appropriate activity limitations    Person(s) Educated  Patient    Methods  Explanation;Tactile cues;Verbal cues    Comprehension  Verbalized understanding;Verbal cues required;Tactile cues required;Need further instruction       PT Short Term Goals - 07/31/17 1751      PT SHORT TERM GOAL #1   Title  Patient will be adherent to home exercise program at least 3x/week for improved hip strengthening.    Baseline  HEP given    Time  2    Period  Weeks    Status  New    Target Date  08/14/17      PT SHORT TERM GOAL #2   Title  Patient will increase knee AROM to 0 deg. ext and 100 deg. flexion for improved functional mobility.    Baseline  Ext -25, flex 80    Time  2    Period  Weeks    Status  New    Target Date  08/14/17      PT SHORT TERM GOAL #3   Title  Patient will decrease R knee pain to 3/10 to demonstrate improved functional capacity with ADLs.    Baseline  8/10    Time  2    Period  Weeks    Status  New    Target Date  08/14/17        PT Long Term Goals - 07/31/17 1843      PT LONG TERM GOAL #1   Title  Patient will show normal hip stability with gait to demonstrate reduced biomechanical load on lateral knee.    Baseline  Bilateral Trendelenberg apparent with gait    Time  4    Period  Weeks    Status  New    Target Date  08/28/17      PT LONG TERM GOAL #2   Title  Patient will increase BLE hip extension and abduction to 4- or higher to demonstrate improved functional capacity with gait and ADLs.     Baseline  Hip ext. 3-, abd 3- BLE    Time  4    Period  Weeks    Status  New    Target Date  08/28/17      PT LONG TERM GOAL #3   Title  Patient will  increase R knee AROM to 120 deg. flexion to demonstrate full functional ROM.    Baseline  R knee AROM flexion 85    Time  4    Period  Weeks    Status  New    Target Date  08/28/17      PT LONG TERM GOAL #4   Title  Patient will demonstrate ability to get into and out of her vehicle without modification and with no increase in R knee pain to show improved functional mobility.    Baseline  Patient must enter vehicle by walking backwards to avoid knee pain.    Time  4    Period  Weeks    Status  New    Target Date  08/28/17            Plan - 08/14/17 1645    Clinical Impression Statement  Patient presents with diffuse attention requiring mod-max cueing for correct exercise technique, especially slowing concentric and eccentric phases. She is able to fully extend RLE knee in sitting without pain but remains limited in flexion. Tolerated hip and knee strengthening exercises well with exception of RLE SLR in abduction. Demonstrates difficulty correcting Trendelenberg gait; with cueing to correct, demonstrates reverse Trendelenberg suggesting significant weakness of BLE gluteus medius.    Rehab Potential  Good    Clinical Impairments Affecting Rehab Potential  - Obesity, chronicity, severity / + Motivated    PT Frequency  2x / week    PT Duration  4 weeks    PT Treatment/Interventions  Moist Heat;Ultrasound;Gait training;Stair training;Functional mobility training;Therapeutic activities;Therapeutic exercise;Patient/family education;Neuromuscular re-education;Manual techniques;Compression bandaging;Passive range of motion;Dry needling;Energy conservation;Taping;Cryotherapy;Aquatic Therapy    PT Next Visit Plan  Hip strengthening    PT Home Exercise Plan  Side-lying clams    Consulted and Agree with Plan of Care  Patient       Patient  will benefit from skilled therapeutic intervention in order to improve the following deficits and impairments:  Abnormal gait, Decreased mobility, Decreased range of motion, Decreased strength, Decreased endurance, Decreased activity tolerance, Improper body mechanics, Pain, Increased edema, Difficulty walking, Obesity, Impaired flexibility  Visit Diagnosis: Chronic pain of right knee  Muscle weakness (generalized)     Problem List Patient Active Problem List   Diagnosis Date Noted  . Acute pain of right knee 05/03/2017  . Chronic pain of both knees 02/19/2017  . Primary osteoarthritis involving multiple joints 02/19/2017  . Bilateral lower extremity edema 02/19/2017  . Chronic venous insufficiency 02/19/2017  . Chronic low back pain 12/21/2016  . LAD (lymphadenopathy), posterior cervical 10/24/2016  . Abnormal glucose 07/09/2016  . Polyuria 07/09/2016  . Trigger thumb of right hand 05/22/2016  . Hot flushes, perimenopausal 06/09/2015  . Hypertension 06/03/2015  . Bipolar affective disorder (HCC) 03/10/2015  . Allergic rhinitis 03/10/2015  . Smoker 03/10/2015  . Obesity (BMI 30-39.9) 03/10/2015   Janee Mornory Nizhoni Parlow, SPT 08/14/2017, 7:33 PM  This entire session was performed under direct supervision and direction of a licensed therapist/therapist assistant . I have personally read, edited and approve of the note as written. Ezekiel InaKristine S Mansfield, PT, DPT  [facility] MAIN [facility] [address] [place], [place], [address] Phone: [phone]   Fax:  [phone]  Name: Daleen Squibbnnie Slagel MRN: [id number] Date of Birth: 09/03/1965

## 2017-08-15 ENCOUNTER — Encounter: Payer: Self-pay | Admitting: Physical Therapy

## 2017-08-19 ENCOUNTER — Ambulatory Visit: Payer: PRIVATE HEALTH INSURANCE | Admitting: Physical Therapy

## 2017-08-21 DIAGNOSIS — G894 Chronic pain syndrome: Secondary | ICD-10-CM | POA: Diagnosis not present

## 2017-08-21 DIAGNOSIS — M25561 Pain in right knee: Secondary | ICD-10-CM | POA: Diagnosis not present

## 2017-08-21 DIAGNOSIS — S83206A Unspecified tear of unspecified meniscus, current injury, right knee, initial encounter: Secondary | ICD-10-CM | POA: Diagnosis not present

## 2017-08-22 ENCOUNTER — Other Ambulatory Visit: Payer: Self-pay | Admitting: Family Medicine

## 2017-08-22 DIAGNOSIS — R600 Localized edema: Secondary | ICD-10-CM

## 2017-08-22 DIAGNOSIS — I10 Essential (primary) hypertension: Secondary | ICD-10-CM
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
## 2017-08-26 ENCOUNTER — Other Ambulatory Visit: Payer: Self-pay | Admitting: Family Medicine

## 2017-08-26 ENCOUNTER — Telehealth: Payer: Self-pay

## 2017-08-26 DIAGNOSIS — F418 Other specified anxiety disorders: Secondary | ICD-10-CM

## 2017-08-26 MED ORDER — LORAZEPAM 0.5 MG PO TABS: ORAL_TABLET | ORAL | 0 refills | Status: AC

## 2017-08-26 NOTE — Telephone Encounter (Signed)
The pt called requesting that you call her in something to help her with her anxiety with flying. She states this will be her first time on a plane. [facility]

## 2017-08-26 NOTE — Telephone Encounter (Addendum)
Patient did not notify us when her flight was on initial message intake.  Her flight is tomorrow.  I reviewed this message would order rx by this afternoon, now she has called back multiple times requesting rx.  Rx now has been sent to pharmacy, see instructions PRN only with flight anxiety situational.  Saralyn PilarAlexander Karamalegos, DO [facility] 08/26/2017, 3:53 PM

## 2017-08-27 ENCOUNTER — Ambulatory Visit: Payer: PRIVATE HEALTH INSURANCE | Admitting: Physical Therapy

## 2017-09-04 ENCOUNTER — Ambulatory Visit: Payer: PRIVATE HEALTH INSURANCE | Admitting: Physical Therapy

## 2017-09-05 ENCOUNTER — Encounter: Payer: Self-pay | Admitting: Physical Therapy

## 2017-09-05 ENCOUNTER — Ambulatory Visit: Payer: PRIVATE HEALTH INSURANCE | Attending: Orthopedic Surgery | Admitting: Physical Therapy

## 2017-09-05 DIAGNOSIS — G8929 Other chronic pain: Secondary | ICD-10-CM | POA: Diagnosis not present

## 2017-09-05 DIAGNOSIS — M6281 Muscle weakness (generalized): Secondary | ICD-10-CM | POA: Diagnosis not present

## 2017-09-05 DIAGNOSIS — M25561 Pain in right knee: Secondary | ICD-10-CM | POA: Diagnosis not present

## 2017-09-05 NOTE — Therapy (Signed)
[place] [facility] MAIN [facility] SERVICES [address] [place], [place], [address] Phone: [phone]   Fax:  [phone]  Physical Therapy Treatment  Patient Details  Name: Dawn Baldwin MRN: [id number] Date of Birth: 10/09/1965 Referring Provider: Juanell Fairly, MD   Encounter Date: 09/05/2017  PT End of Session - 09/05/17 1432    Visit Number  4    Number of Visits  9    Date for PT Re-Evaluation  08/28/17    PT Start Time  0145    PT Stop Time  0230    PT Time Calculation (min)  45 min    Activity Tolerance  Patient tolerated treatment well;Patient limited by pain    Behavior During Therapy  [facility] for tasks assessed/performed       Past Medical History:  Diagnosis Date  . Allergy   . Anemia   . Bipolar affective disorder (HCC)   . Depression   . GERD (gastroesophageal reflux disease)   . Hypertension     Past Surgical History:  Procedure Laterality Date  . TRIGGER FINGER RELEASE  11/23/2016    There were no vitals filed for this visit.  Subjective Assessment - 09/05/17 1429    Subjective  Patient is able to walk better and her knee is beginning to feel better.Patient came to therapy and was very distracted about a new job she is trying to fill out papers for.    Pertinent History  Onset of knee pain occurred three months ago when R knee "locked up" in church. Patient mentions that she used to do a lot of dance on her knees and "may be paying for it now". Patient has pain with walking her dogs, negotiating stairs, getting into and out of car. The knee also bothers her with prolonged extension (such as during driving). 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 Knee pain has proven intractable to two cortisone shots; patient considering surgery but wants to try physical therapy first.    Limitations  Walking    How long can you sit comfortably?  n/a    How long can you stand comfortably?  n/a    How  long can you walk comfortably?  Cannot walk comfortably    Diagnostic tests  MRI shows meniscus tear    Patient Stated Goals  Improve knee, exercise, avoid surgery    Pain Onset  More than a month ago       Treatment:  Nu-step x 5 mins  Leg press 90 lbs x 20 x 3 single leg  LAQ no resistance x 10  Patient was DC due to being independent with HEP and able to perform the exercises at home and at the gym.   Patient has no increased pain during treatment, tolerated treatment well.                    PT Education - 09/05/17 1431    Education provided  Yes    Education Details  HEP review    Person(s) Educated  Patient    Methods  Explanation;Demonstration    Comprehension  Verbalized understanding;Returned demonstration;Tactile cues required       PT Short Term Goals - 07/31/17 1751      PT SHORT TERM GOAL #1   Title  Patient will be adherent to home exercise program at least 3x/week for improved hip strengthening.    Baseline  HEP given    Time  2    Period  Weeks    Status  New    Target Date  08/14/17      PT SHORT TERM GOAL #2   Title  Patient will increase knee AROM to 0 deg. ext and 100 deg. flexion for improved functional mobility.    Baseline  Ext -25, flex 80    Time  2    Period  Weeks    Status  New    Target Date  08/14/17      PT SHORT TERM GOAL #3   Title  Patient will decrease R knee pain to 3/10 to demonstrate improved functional capacity with ADLs.    Baseline  8/10    Time  2    Period  Weeks    Status  New    Target Date  08/14/17        PT Long Term Goals - 09/05/17 1504      PT LONG TERM GOAL #1   Title  Patient will show normal hip stability with gait to demonstrate reduced biomechanical load on lateral knee.    Time  2    Period  Weeks    Status  On-going    Target Date  09/11/17      PT LONG TERM GOAL #2   Title  Patient will increase BLE hip extension and abduction to 4- or higher to demonstrate improved  functional capacity with gait and ADLs.    Time  2    Period  Weeks    Status  On-going    Target Date  09/11/17      PT LONG TERM GOAL #3   Title  Patient will increase R knee AROM to 120 deg. flexion to demonstrate full functional ROM.    Baseline  R knee AROM flexion 100    Time  2    Period  Weeks    Status  On-going    Target Date  09/11/17      PT LONG TERM GOAL #4   Title  Patient will demonstrate ability to get into and out of her vehicle without modification and with no increase in R knee pain to show improved functional mobility.    Time  2    Period  Weeks    Status  On-going    Target Date  09/11/17            Plan - 09/05/17 1433    Clinical Impression Statement  Patient performs LE exericses to improve mobility and strength and flexibility in right knee. She performs exercises without increasing her pain. She will be discharged to HEP.     Rehab Potential  Good    Clinical Impairments Affecting Rehab Potential  - Obesity, chronicity, severity / + Motivated    PT Frequency  2x / week    PT Duration  4 weeks    PT Treatment/Interventions  Moist Heat;Ultrasound;Gait training;Stair training;Functional mobility training;Therapeutic activities;Therapeutic exercise;Patient/family education;Neuromuscular re-education;Manual techniques;Compression bandaging;Passive range of motion;Dry needling;Energy conservation;Taping;Cryotherapy;Aquatic Therapy    PT Next Visit Plan  Hip strengthening    PT Home Exercise Plan  Side-lying clams    Consulted and Agree with Plan of Care  Patient       Patient will benefit from skilled therapeutic intervention in order to improve the following deficits and impairments:  Abnormal gait, Decreased mobility, Decreased range of motion, Decreased strength, Decreased endurance, Decreased activity tolerance, Improper body mechanics, Pain, Increased edema, Difficulty walking, Obesity, Impaired flexibility  Visit Diagnosis: Chronic pain of right  knee  Muscle weakness (generalized)     Problem List Patient Active Problem List   Diagnosis Date Noted  . Acute pain of right knee 05/03/2017  . Chronic pain of both knees 02/19/2017  . Primary osteoarthritis involving multiple joints 02/19/2017  . Bilateral lower extremity edema 02/19/2017  . Chronic venous insufficiency 02/19/2017  . Chronic low back pain 12/21/2016  . LAD (lymphadenopathy), posterior cervical 10/24/2016  . Abnormal glucose 07/09/2016  . Polyuria 07/09/2016  . Trigger thumb of right hand 05/22/2016  . Hot flushes, perimenopausal 06/09/2015  . Hypertension 06/03/2015  . Bipolar affective disorder (HCC) 03/10/2015  . Allergic rhinitis 03/10/2015  . Smoker 03/10/2015  . Obesity (BMI 30-39.9) 03/10/2015    Ezekiel Ina, PT DPT 09/05/2017, 3:39 PM  [place] [facility] MAIN [facility] SERVICES [address] [place], [place], [address] Phone: [phone]   Fax:  [phone]  Name: Dawn Baldwin MRN: [id number] Date of Birth: [birth year]

## 2017-09-05 NOTE — Addendum Note (Signed)
Addended by: Ezekiel InaMANSFIELD, Daven Montz S on: 09/05/2017 03:34 PM   Modules accepted: Orders

## 2017-09-24 ENCOUNTER — Other Ambulatory Visit: Payer: Self-pay | Admitting: Family Medicine

## 2017-09-24 ENCOUNTER — Telehealth: Payer: Self-pay | Admitting: Family Medicine

## 2017-09-24 DIAGNOSIS — J3089 Other allergic rhinitis: Secondary | ICD-10-CM

## 2017-09-24 DIAGNOSIS — R600 Localized edema: Secondary | ICD-10-CM

## 2017-09-24 DIAGNOSIS — I10 Essential (primary) hypertension: Secondary | ICD-10-CM

## 2017-09-24 NOTE — Telephone Encounter (Signed)
Pt needs a refill on hydrochlorothiazide sent to [facility] in [place].  Her call back number is [phone]

## 2017-10-14 ENCOUNTER — Ambulatory Visit: Payer: PRIVATE HEALTH INSURANCE | Attending: Orthopedic Surgery | Admitting: Physical Therapy

## 2017-10-14 ENCOUNTER — Encounter: Payer: Self-pay | Admitting: Physical Therapy

## 2017-10-14 ENCOUNTER — Other Ambulatory Visit: Payer: Self-pay

## 2017-10-14 DIAGNOSIS — G8929 Other chronic pain: Secondary | ICD-10-CM | POA: Diagnosis present

## 2017-10-14 DIAGNOSIS — M25561 Pain in right knee: Secondary | ICD-10-CM | POA: Insufficient documentation

## 2017-10-14 DIAGNOSIS — M6281 Muscle weakness (generalized): Secondary | ICD-10-CM | POA: Diagnosis present

## 2017-10-14 NOTE — Therapy (Signed)
[place] [facility] MAIN [facility] SERVICES [address] [place], [place], [address] Phone: [phone]   Fax:  [phone]  Physical Therapy Evaluation  Patient Details  Name: Dawn Baldwin MRN: [id number] Date of Birth: 01/11/1966 Referring Provider: Juanell Fairly, MD   Encounter Date: 10/14/2017  PT End of Session - 10/14/17 1713    Visit Number  1    Number of Visits  17    Date for PT Re-Evaluation  12/09/17    PT Start Time  0500    PT Stop Time  0600    PT Time Calculation (min)  60 min    Equipment Utilized During Treatment  Gait belt    Activity Tolerance  Patient tolerated treatment well;Patient limited by pain    Behavior During Therapy  [facility] for tasks assessed/performed       Past Medical History:  Diagnosis Date  . Allergy   . Anemia   . Bipolar affective disorder (HCC)   . Depression   . GERD (gastroesophageal reflux disease)   . Hypertension     Past Surgical History:  Procedure Laterality Date  . TRIGGER FINGER RELEASE  11/23/2016    There were no vitals filed for this visit.   Subjective Assessment - 10/14/17 1710    Subjective  Patient is having pain in right knee and it started 3-4 months ago.     Pertinent History  Onset of knee pain occurred four months ago when R knee "locked up" in church. Patient mentions that she used to do a lot of dance on her knees and "may be paying for it now". Patient has pain with walking her dogs, negotiating stairs, getting into and out of car. The knee also bothers her with prolonged extension (such as during driving). 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 Knee pain has proven intractable to two cortisone shots; patient considering surgery but wants to try physical therapy first.    Limitations  Walking    How long can you sit comfortably?  n/a    How long can you stand comfortably?  n/a    How long can you walk comfortably?  5 miles    Diagnostic tests  MRI shows meniscus tear    Patient Stated Goals  Improve knee, exercise, avoid surgery    Currently in Pain?  Yes    Pain Score  5     Pain Location  Knee    Pain Orientation  Right    Pain Descriptors / Indicators  Aching    Pain Type  Chronic pain    Pain Onset  More than a month ago    Pain Frequency  Constant    Aggravating Factors   lifting leg , bending knee    Pain Relieving Factors  medications    Effect of Pain on Daily Activities  limits ADL    Multiple Pain Sites  No         [facility] PT Assessment - 10/14/17 1715      Assessment   Medical Diagnosis  Pain in R knee    Onset Date/Surgical Date  04/30/17    Hand Dominance  Right    Prior Therapy  Yes      Precautions   Precautions  None      Restrictions   Weight Bearing Restrictions  No      Balance Screen   Has the patient fallen in the past 6 months  No    Has the patient had a decrease in activity level because of a fear of falling?   Yes    Is the patient reluctant to leave their home because of a fear of falling?   No      Home Environment   Living Environment  Private residence    Living Arrangements  Spouse/significant other    Available Help at Discharge  Family    Type of Home  House    Home Access  Stairs to enter    Entrance Stairs-Number of Steps  4    Entrance Stairs-Rails  None    Home Layout  Two level    Alternate Level Stairs-Number of Steps  10    Alternate Level Stairs-Rails  Right;Left    Home Equipment  None      Prior Function   Level of Independence  Independent with basic ADLs;Independent with household mobility without device    Vocation  Full time employment    Vocation Requirements  [employer]    Leisure  Walk dogs      Cognition   Overall Cognitive Status  Within Functional Limits for tasks assessed         POSTURE: WNL   PROM/AROM R knee AROM 0-110 deg, PROM 0- 115  L knee AROM and PROM 0-125 deg   STRENGTH:  Graded on a 0-5 scale Muscle Group Left  Right                          Hip Flex 5/5 4/5  Hip Abd 5/5 3/5  Hip Add 5/5 3/5  Hip Ext 5/5 2+/5  Hip IR/ER 5/5 3/5  Knee Flex 5/5 4/5  Knee Ext 5/5 4/5  Ankle DF 5/5 5/5  Ankle PF 5/5 5/5   SENSATION: no numbness in RLE   FUNCTIONAL MOBILITY:   Independent with transfers and gait Patient is unable to squat    GAIT: Patient ambulates without AD with slow gait speed, unable to run; B Trendelenberg, B knee valgus, B knee hyperextension in stance phase. Mild reduced step length.  OUTCOME MEASURES: TEST Outcome Interpretation  5 times sit<>stand 12.51sec >21 yo, >15 sec indicates increased risk for falls  10 meter walk test 1.10                m/s <1.0 m/s indicates increased risk for falls; limited community ambulator  LEFS 70/80 80/80 is normal               Treatment: hip abd, hip ext,  and knee extension x 10 reps RLE No increased pain following evaluation          Objective measurements completed on examination: See above findings.              PT Education - 10/14/17 1712    Education provided  Yes    Education Details  plan of care    Person(s) Educated  Patient    Methods  Explanation    Comprehension  Verbalized understanding       PT Short Term Goals - 10/14/17 1747      PT SHORT TERM GOAL #1   Title  Patient will be adherent to home exercise program at least 3x/week for improved hip strengthening.    Baseline  HEP given    Time  4    Period  Weeks    Status  New    Target Date  11/11/17      PT SHORT TERM GOAL #2   Title  Patient will increase right knee AROM to 0 deg. ext and 120 deg. flexion for improved functional mobility.    Baseline   flex 110 deg right knee    Time  4    Period  Weeks    Status  New    Target Date  11/11/17      PT SHORT TERM GOAL #3   Title  Patient will decrease R knee pain to 3/10 to demonstrate improved functional capacity with ADLs.    Baseline  5/10    Time  4    Period  Weeks     Status  New    Target Date  11/11/17        PT Long Term Goals - 10/14/17 1714      PT LONG TERM GOAL #1   Title  Patient will show normal hip stability with gait to demonstrate reduced biomechanical load on lateral knee.    Baseline  Bilateral Trendelenberg apparent with gait    Time  8    Period  Weeks    Status  New    Target Date  12/09/17      PT LONG TERM GOAL #2   Title  Patient will increase BLE hip extension and abduction to 4- or higher to demonstrate improved functional capacity with gait and ADLs.    Baseline  Hip ext. 3-, abd 3- BLE    Time  8    Period  Weeks    Status  New    Target Date  12/09/17      PT LONG TERM GOAL #3   Title  Patient will be able to squat with BLE without increased pain to right knee.    Baseline  --    Time  8    Period  Weeks    Status  New    Target Date  12/09/17             Plan - 10/14/17 1751    Clinical Impression Statement  Pleasant 52 yo presents to physical therapy with chronic R knee pain. MRI in Jan showed lateral meniscus tear, mild medial meniscus degeneration, Baker's cyst, and ganglion cyst on the intercondylar notch of the R knee. Patient cannot actively extend knee to neutral in sitting and is limited to 80 deg. active flexion. Posterior knee shows mild edema. Hips are substantially weak bilaterally. Special tests consistent with lateral meniscus involvement. Patient demonstrates bilateral Trendelenberg, knee valgus, and knee hyperextension in stance phase of gait, suggesting increased load through the lateral knee due to hip weakness and poor gait biomechanics. Patient would benefit from skilled physical therapy to improve knee ROM, reduce pain, and improve hip strength for return to PLOF.     Rehab Potential  Good    Clinical Impairments Affecting Rehab Potential  - Obesity, chronicity, severity / + Motivated    PT Frequency  2x / week    PT Duration  4 weeks    PT Treatment/Interventions  Moist  Heat;Ultrasound;Gait training;Stair training;Functional mobility training;Therapeutic activities;Therapeutic exercise;Patient/family education;Neuromuscular re-education;Manual techniques;Compression bandaging;Passive range of motion;Dry needling;Energy conservation;Taping;Cryotherapy;Aquatic Therapy    PT Next Visit Plan  Hip strengthening    PT Home Exercise Plan  Side-lying clams    Consulted and Agree with Plan of Care  Patient       Patient will benefit from skilled therapeutic intervention in order to improve the following deficits  and impairments:  Abnormal gait, Decreased mobility, Decreased range of motion, Decreased strength, Decreased endurance, Decreased activity tolerance, Improper body mechanics, Pain, Increased edema, Difficulty walking, Obesity, Impaired flexibility  Visit Diagnosis: Chronic pain of right knee  Muscle weakness (generalized)     Problem List Patient Active Problem List   Diagnosis Date Noted  . Acute pain of right knee 05/03/2017  . Chronic pain of both knees 02/19/2017  . Primary osteoarthritis involving multiple joints 02/19/2017  . Bilateral lower extremity edema 02/19/2017  . Chronic venous insufficiency 02/19/2017  . Chronic low back pain 12/21/2016  . LAD (lymphadenopathy), posterior cervical 10/24/2016  . Abnormal glucose 07/09/2016  . Polyuria 07/09/2016  . Trigger thumb of right hand 05/22/2016  . Hot flushes, perimenopausal 06/09/2015  . Hypertension 06/03/2015  . Bipolar affective disorder (HCC) 03/10/2015  . Allergic rhinitis 03/10/2015  . Smoker 03/10/2015  . Obesity (BMI 30-39.9) 03/10/2015    Ezekiel Ina, PT DPT 10/14/2017, 5:52 PM  [place] [facility] MAIN [facility] SERVICES [address] [place], [place], [address] Phone: [phone]   Fax:  [phone]  Name: Dawn Baldwin MRN: [id number] Date of Birth: 06-11-1966

## 2017-10-15 NOTE — Addendum Note (Signed)
Addended by: Ezekiel InaMANSFIELD, Ivelisse Culverhouse S on: 10/15/2017 10:25 AM   Modules accepted: Orders

## 2017-10-23 ENCOUNTER — Ambulatory Visit: Payer: Medicare HMO | Admitting: Physical Therapy

## 2017-10-28 ENCOUNTER — Ambulatory Visit: Payer: Medicare HMO | Attending: Orthopedic Surgery | Admitting: Physical Therapy

## 2017-10-29 MED ORDER — MIDAZOLAM HCL 2 MG/2ML IJ SOLN
INTRAMUSCULAR | Status: AC
  Filled 2017-10-29: qty 2

## 2017-10-29 MED ORDER — FENTANYL CITRATE (PF) 100 MCG/2ML IJ SOLN
INTRAMUSCULAR | Status: AC
  Filled 2017-10-29: qty 2

## 2017-10-29 MED ORDER — PROPOFOL 10 MG/ML IV BOLUS
INTRAVENOUS | Status: AC
  Filled 2017-10-29: qty 20

## 2017-10-29 MED ORDER — GLYCOPYRROLATE 0.2 MG/ML IJ SOLN
INTRAMUSCULAR | Status: AC
  Filled 2017-10-29: qty 1

## 2017-10-29 MED ORDER — ONDANSETRON HCL 4 MG/2ML IJ SOLN
INTRAMUSCULAR | Status: AC
  Filled 2017-10-29: qty 2

## 2017-10-29 MED ORDER — LIDOCAINE HCL (PF) 2 % IJ SOLN
INTRAMUSCULAR | Status: AC
  Filled 2017-10-29: qty 10

## 2017-10-29 MED ORDER — KETAMINE HCL 50 MG/ML IJ SOLN
INTRAMUSCULAR | Status: AC
Start: 2017-10-29 — End: 2017-10-29
  Filled 2017-10-29: qty 10

## 2017-10-30 ENCOUNTER — Ambulatory Visit: Payer: Medicare HMO | Admitting: Physical Therapy

## 2017-11-04 ENCOUNTER — Ambulatory Visit: Payer: Medicare HMO | Admitting: Physical Therapy

## 2017-11-06 ENCOUNTER — Ambulatory Visit: Payer: Medicare HMO | Admitting: Physical Therapy

## 2017-11-11 ENCOUNTER — Ambulatory Visit: Payer: Medicare HMO | Admitting: Physical Therapy

## 2017-11-13 ENCOUNTER — Ambulatory Visit: Payer: Medicare HMO | Admitting: Physical Therapy

## 2017-11-20 ENCOUNTER — Ambulatory Visit: Payer: Medicare HMO | Admitting: Physical Therapy

## 2017-12-23 ENCOUNTER — Other Ambulatory Visit: Payer: Self-pay | Admitting: Family Medicine

## 2017-12-23 DIAGNOSIS — R600 Localized edema: Secondary | ICD-10-CM

## 2017-12-23 DIAGNOSIS — I10 Essential (primary) hypertension: Secondary | ICD-10-CM

## 2018-02-27 ENCOUNTER — Other Ambulatory Visit: Payer: Self-pay | Admitting: Family Medicine

## 2018-02-27 DIAGNOSIS — R600 Localized edema: Secondary | ICD-10-CM

## 2018-02-27 DIAGNOSIS — I10 Essential (primary) hypertension: Secondary | ICD-10-CM

## 2018-02-27 MED ORDER — HYDROCHLOROTHIAZIDE 25 MG PO TABS: 25.0000 mg | ORAL_TABLET | Freq: Every day | ORAL | 0 refills | Status: DC

## 2018-02-27 NOTE — Telephone Encounter (Signed)
Rx send

## 2018-02-27 NOTE — Telephone Encounter (Signed)
Pt called requesting refill on BP  [facility] [address] [place] Pt. call back  # is [phone]

## 2018-04-30 ENCOUNTER — Other Ambulatory Visit: Payer: Self-pay | Admitting: Family Medicine

## 2018-04-30 DIAGNOSIS — R600 Localized edema: Secondary | ICD-10-CM

## 2018-04-30 DIAGNOSIS — I10 Essential (primary) hypertension: Secondary | ICD-10-CM

## 2018-05-27 ENCOUNTER — Other Ambulatory Visit: Payer: Self-pay | Admitting: Family Medicine

## 2018-05-27 DIAGNOSIS — R600 Localized edema: Secondary | ICD-10-CM

## 2018-05-27 DIAGNOSIS — I10 Essential (primary) hypertension: Secondary | ICD-10-CM
